# Patient Record
Sex: Male | Born: 1980 | Race: White | Hispanic: No | Marital: Single | Smoking: Current every day smoker
Health system: Southern US, Community
[De-identification: ages and names within clinical notes are randomized; demographics above are authoritative.]

## PROBLEM LIST (undated history)

## (undated) DIAGNOSIS — T8859XA Other complications of anesthesia, initial encounter: Secondary | ICD-10-CM

## (undated) DIAGNOSIS — K219 Gastro-esophageal reflux disease without esophagitis: Secondary | ICD-10-CM

## (undated) HISTORY — PX: WISDOM TOOTH EXTRACTION: SHX21

## (undated) HISTORY — DX: Gastro-esophageal reflux disease without esophagitis: K21.9

---
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
## 2006-12-23 ENCOUNTER — Ambulatory Visit: Payer: Self-pay | Admitting: Internal Medicine

## 2009-09-10 ENCOUNTER — Emergency Department (HOSPITAL_COMMUNITY): Admission: EM | Admit: 2009-09-10 | Discharge: 2009-09-10 | Payer: Self-pay | Admitting: Emergency Medicine

## 2011-08-15 ENCOUNTER — Inpatient Hospital Stay (INDEPENDENT_AMBULATORY_CARE_PROVIDER_SITE_OTHER)
Admission: RE | Admit: 2011-08-15 | Discharge: 2011-08-15 | Disposition: A | Discharge status: Home / Self Care | Payer: Self-pay | Source: Ambulatory Visit | Attending: Family Medicine | Admitting: Family Medicine

## 2011-08-15 DIAGNOSIS — J019 Acute sinusitis, unspecified: Secondary | ICD-10-CM

## 2014-12-20 ENCOUNTER — Emergency Department (INDEPENDENT_AMBULATORY_CARE_PROVIDER_SITE_OTHER)
Admission: EM | Admit: 2014-12-20 | Discharge: 2014-12-20 | Disposition: A | Discharge status: Home / Self Care | Payer: Self-pay | Source: Home / Self Care | Attending: Emergency Medicine | Admitting: Emergency Medicine

## 2014-12-20 ENCOUNTER — Encounter (HOSPITAL_COMMUNITY): Payer: Self-pay | Admitting: *Deleted

## 2014-12-20 DIAGNOSIS — J069 Acute upper respiratory infection, unspecified: Secondary | ICD-10-CM

## 2014-12-20 LAB — POCT RAPID STREP A: STREPTOCOCCUS, GROUP A SCREEN (DIRECT): NEGATIVE

## 2014-12-20 MED ORDER — NAPROXEN 500 MG PO TABS: 500.0000 mg | ORAL_TABLET | Freq: Two times a day (BID) | ORAL | Status: DC

## 2014-12-20 MED ORDER — IPRATROPIUM BROMIDE 0.06 % NA SOLN: 2.0000 | Freq: Four times a day (QID) | NASAL | Status: DC

## 2014-12-20 MED ORDER — BENZONATATE 200 MG PO CAPS: 200.0000 mg | ORAL_CAPSULE | Freq: Three times a day (TID) | ORAL | Status: DC | PRN

## 2014-12-20 NOTE — Discharge Instructions (Signed)

## 2014-12-20 NOTE — ED Notes (Signed)
C/o sore throat onset 1 week ago.  Started waking up with sinus pain 3 days ago, then developed a prod. cough of green sputum.  Had chills or fever.  C/o pressure in his ears, lightheaded and dizzy.

## 2014-12-20 NOTE — ED Provider Notes (Signed)
   Chief Complaint   URI   History of Present Illness   Douglas Maxwell is a 34 year old male who presents with one-week history of nasal congestion with yellow-green drainage, sinus pressure, ear pressure, cough productive green sputum, wheezing, chest pain, sore throat, subjective fever, sweats, chills, and diarrhea. He has no known sick exposures.  Review of Systems   Other than as noted above, the patient denies any of the following symptoms: Systemic:  No fevers, chills, sweats, or myalgias. Eye:  No redness or discharge. ENT:  No ear pain, headache, nasal congestion, drainage, sinus pressure, or sore throat. Neck:  No neck pain, stiffness, or swollen glands. Lungs:  No cough, sputum production, hemoptysis, wheezing, chest tightness, shortness of breath or chest pain. GI:  No abdominal pain, nausea, vomiting or diarrhea.  PMFSH   Past medical history, family history, social history, meds, and allergies were reviewed. He's allergic to antihistamines.  Physical exam   Vital signs:  BP 121/82 mmHg  Pulse 105  Temp(Src) 97.9 F (36.6 C) (Oral)  Resp 16  SpO2 98% General:  Alert and oriented.  In no distress.  Skin warm and dry. Eye:  No conjunctival injection or drainage. Lids were normal. ENT:  TMs and canals were normal, without erythema or inflammation.  Nasal mucosa was clear and uncongested, without drainage.  Mucous membranes were moist.  Pharynx was clear with no exudate or drainage.  There were no oral ulcerations or lesions. Neck:  Supple, no adenopathy, tenderness or mass. Lungs:  No respiratory distress.  Lungs were clear to auscultation, without wheezes, rales or rhonchi.  Breath sounds were clear and equal bilaterally.  Heart:  Regular rhythm, without gallops, murmers or rubs. Skin:  Clear, warm, and dry, without rash or lesions.  Labs   Results for orders placed or performed during the hospital encounter of 12/20/14  POCT rapid strep A [facility]    Result Value Ref Range   Streptococcus, Group A Screen (Direct) NEGATIVE NEGATIVE    Assessment     The encounter diagnosis was Viral URI.  There is no evidence of pneumonia, strep throat, sinusitis, otitis media.    Plan    1.  Meds:  The following meds were prescribed:   Discharge Medication List as of 12/20/2014  8:31 PM    START taking these medications   Details  benzonatate (TESSALON) 200 MG capsule Take 1 capsule (200 mg total) by mouth 3 (three) times daily as needed for cough., Starting 12/20/2014, Until Discontinued, Normal    ipratropium (ATROVENT) 0.06 % nasal spray Place 2 sprays into both nostrils 4 (four) times daily., Starting 12/20/2014, Until Discontinued, Normal    naproxen (NAPROSYN) 500 MG tablet Take 1 tablet (500 mg total) by mouth 2 (two) times daily., Starting 12/20/2014, Until Discontinued, Normal        2.  Patient Education/Counseling:  The patient was given appropriate handouts, self care instructions, and instructed in symptomatic relief.  Instructed to get extra fluids and extra rest.    3.  Follow up:  The patient was told to follow up here if no better in 3 to 4 days, or sooner if becoming worse in any way, and given some red flag symptoms such as increasing fever, difficulty breathing, chest pain, or persistent vomiting which would prompt immediate return.       Reuben Likesavid C Edon Hoadley, MD 12/20/14 2056

## 2014-12-23 LAB — CULTURE, GROUP A STREP

## 2014-12-24 ENCOUNTER — Telehealth (HOSPITAL_COMMUNITY): Payer: Self-pay | Admitting: *Deleted

## 2014-12-24 MED ORDER — AMOXICILLIN 500 MG PO CAPS: 500.0000 mg | ORAL_CAPSULE | Freq: Three times a day (TID) | ORAL | Status: DC

## 2014-12-24 NOTE — ED Notes (Addendum)
Throat culture: Streptococcus beta hemolytic not group A strep.  Dr. Lorenz CoasterKeller sent a message to have someone put in order for Amoxicillin 500 mg. TID x 5 days.  I asked Almedia BallsZach Baker PA to put in order.  [facility] [address] I called pt. But mailbox is full.  I sent a phone number to call.  Call 1. Vassie MoselleYork, Toris Laverdiere M 12/24/2014 I called pt. Pt. verified x 2 and given result.  Pt. told he needs Amoxicillin for strep throat.  He said [facility] and they told him about the Rx. of Amoxicillin, so he is taking it since Sunday.  Instructed to finish all of medicine, if not better after to get rechecked, and if anyone he exposed gets the same symptoms should get checked for strep as well.  Pt. Vviced understanding. Vassie MoselleYork, Keyasha Miah M 12/27/2014

## 2015-05-12 ENCOUNTER — Encounter (HOSPITAL_COMMUNITY): Payer: Self-pay | Admitting: Emergency Medicine

## 2015-05-12 ENCOUNTER — Emergency Department (HOSPITAL_COMMUNITY)
Admission: EM | Admit: 2015-05-12 | Discharge: 2015-05-12 | Disposition: A | Discharge status: Home / Self Care | Payer: Self-pay | Attending: Emergency Medicine | Admitting: Emergency Medicine

## 2015-05-12 DIAGNOSIS — K029 Dental caries, unspecified: Secondary | ICD-10-CM | POA: Insufficient documentation

## 2015-05-12 DIAGNOSIS — Z720 Tobacco use: Secondary | ICD-10-CM | POA: Insufficient documentation

## 2015-05-12 DIAGNOSIS — Z791 Long term (current) use of non-steroidal anti-inflammatories (NSAID): Secondary | ICD-10-CM | POA: Insufficient documentation

## 2015-05-12 MED ORDER — PENICILLIN V POTASSIUM 500 MG PO TABS: 500.0000 mg | ORAL_TABLET | Freq: Three times a day (TID) | ORAL | Status: DC

## 2015-05-12 MED ORDER — IBUPROFEN 800 MG PO TABS: 800.0000 mg | ORAL_TABLET | Freq: Three times a day (TID) | ORAL | Status: DC

## 2015-05-12 MED ORDER — PENICILLIN V POTASSIUM 500 MG PO TABS
500.0000 mg | ORAL_TABLET | Freq: Once | ORAL | Status: AC
  Administered 2015-05-12: 500 mg via ORAL
  Filled 2015-05-12: qty 1

## 2015-05-12 NOTE — ED Notes (Signed)
Pt states he has been having dental pain in upper jaw in middle section x 2 weeks. There is some tooth decay and swelling noted.

## 2015-05-12 NOTE — Discharge Instructions (Signed)
Dental Caries °Dental caries is tooth decay. This decay can cause a hole in teeth (cavity) that can get bigger and deeper over time. °HOME CARE °· Brush and floss your teeth. Do this at least two times a day. °· Use a fluoride toothpaste. °· Use a mouth rinse if told by your dentist or doctor. °· Eat less sugary and starchy foods. Drink less sugary drinks. °· Avoid snacking often on sugary and starchy foods. Avoid sipping often on sugary drinks. °· Keep regular checkups and cleanings with your dentist. °· Use fluoride supplements if told by your dentist or doctor. °· Allow fluoride to be applied to teeth if told by your dentist or doctor. °Document Released: 08/12/2008 Document Revised: 03/20/2014 Document Reviewed: 11/05/2012 °ExitCare® Patient Information ©2015 [employer]. This information is not intended to replace advice given to you by your health care provider. Make sure you discuss any questions you have with your health care provider. ° °Emergency Department Resource Guide °1) Find a Doctor and Pay Out of Pocket °Although you won't have to find out who is covered by your insurance plan, it is a good idea to ask around and get recommendations. You will then need to call the office and see if the doctor you have chosen will accept you as a new patient and what types of options they offer for patients who are self-pay. Some doctors offer discounts or will set up payment plans for their patients who do not have insurance, but you will need to ask so you aren't surprised when you get to your appointment. ° °2) Contact Your Local Health Department °Not all health departments have doctors that can see patients for sick visits, but many do, so it is worth a call to see if yours does. If you don't know where your local health department is, you can check in your phone book. The CDC also has a tool to help you locate your state's health department, and many state websites also have listings of all of their local  health departments. ° °3) Find a Walk-in Clinic °If your illness is not likely to be very severe or complicated, you may want to try a walk in clinic. These are popping up all over the country in pharmacies, drugstores, and shopping centers. They're usually staffed by nurse practitioners or physician assistants that have been trained to treat common illnesses and complaints. They're usually fairly quick and inexpensive. However, if you have serious medical issues or chronic medical problems, these are probably not your best option. ° °No Primary Care Doctor: °- Call Health Connect at  832-8000 - they can help you locate a primary care doctor that  accepts your insurance, provides certain services, etc. °- Physician Referral Service- 1-800-533-3463 ° °Chronic Pain Problems: °Organization         Address  Phone   Notes  °[facility]  ([phone]) [phone] Patients need to be referred by their primary care doctor.  ° °Medication Assistance: °Organization         Address  Phone   Notes  °[place] [facility] [address] °[place], [place] [address] (336) 641-8030 --Must be a resident of [place] °-- Must have NO insurance coverage whatsoever (no Medicaid/ Medicare, etc.) °-- The pt. MUST have a primary care doctor that directs their care regularly and follows them in the community °  °[facility]  (866) 331-1348   °[employer]  (888) 892-1162   ° °Agencies that   provide inexpensive medical care: °Organization         Address  Phone   Notes  °[facility]  ([phone]) [phone]   °[facility]    (336) 832-7272   °[facility] [address] °[place], [place] [address] (336) 832-4777   °[facility] [address], °[place] (336) 271-4999   °[facility]    (336) 373-0678   °[facility]    (336) 272-1050   °[facility] ° [address], [place] Phone:   (336) 832-4444, Fax:  (336) 832-4440 Hours of Operation:  9 am - 6 pm, M-F.  Also accepts Medicaid/Medicare and self-pay.  °[facility] ° [address], [place] Phone: (336) [phone], Fax: (336) [phone]. Hours of Operation:  8:30 am - 5:30 pm, M-F.  Also accepts Medicaid and self-pay.  °[facility] [place] [address], [place] Phone: (336) [phone]   °[facility] [address], [place], [place] (336)723-1848, Ext. 123 Mondays & Thursdays: 7-9 AM.  First 15 patients are seen on a first come, first serve basis. °  ° °Medicaid-accepting [place] Providers: ° °Organization         Address  Phone   Notes  °[facility] [address], [place] ([phone]) [phone] Also accepts self-pay patients.  °[facility] [address], [place] ° (336) [phone]   °[facility] [address], [place] (336) [phone]   °[facility] [address] [place] [address], [place] (336) [phone]   °[facility] [address], [place]  ° (336) [phone] Only accepts [place] Access Medicaid patients after they have their name applied to their card.  ° °Self-Pay (no insurance) in [place]: ° °Organization         Address  Phone   Notes  °Sickle Cell Patients, [facility] [address], [place] (336) [phone]   °[facility] [address],  [place] (336) [phone]   °[facility] ° [address], [place] (336) [phone]   °[facility]/Dr. Osei-Bonsu ° [address] [place] [address], [place] or [address], [place] ([phone]) [phone] Phone number for both [place] and [place] locations is the same.  °[facility] [address], [place] (336) [phone]   °[facility] [address] [place] [address], [place] or [address] (336)  [phone] °(336) 878-2260   °[facility] [address], [place] (336) 350-1642, phone; (336) 294-5005, fax Sees patients 1st and 3rd Saturday of every month.  Must not qualify for public or private insurance (i.e. Medicaid, Medicare, [place] Health Choice, Veterans' Benefits) • Household income should be no more than 200% of the poverty level •The clinic cannot treat you if you are pregnant or think you are pregnant • Sexually transmitted diseases are not treated at the clinic.  ° ° °Dental Care: °Organization         Address  Phone  Notes  °[facility] [address],  (336) [phone] Accepts children up to age 21 who are enrolled in Medicaid or [place] Health Choice; pregnant women with a Medicaid card; and children who have applied for Medicaid or [place] Health   Choice, but were declined, whose parents can pay a reduced fee at time of service.  °[facility] [place]  [address], [place] (336) 641-7733 Accepts children up to age 21 who are enrolled in Medicaid or [place] Health Choice; pregnant women with a Medicaid card; and children who have applied for Medicaid or [place] Health Choice, but were declined, whose parents can pay a reduced fee at time of service.  °[facility] ° [address], [place] (336) [phone] Patients are seen by appointment only. Walk-ins are not accepted. [facility] will see patients 18 years of age and older. °Monday - Tuesday (8am-5pm) °Most Wednesdays (8:30-5pm) °$30 per visit, cash only  °[facility] ° [address], [place] (336) [phone] Patients are seen by appointment only. Walk-ins are not accepted. [facility] will see patients 18 years of age and older. °One Wednesday Evening (Monthly: Volunteer Based).  $30 per visit, cash only  °[facility]  (919) 537-3737 for adults;  Children under age 4, call Graduate Pediatric Dentistry at ([phone]) [phone]. Children aged 4-14, please call ([phone]) [phone] to request a pediatric application. ° Dental services are provided in all areas of dental care including fillings, crowns and bridges, complete and partial dentures, implants, gum treatment, root canals, and extractions. Preventive care is also provided. Treatment is provided to both adults and children. °Patients are selected via a lottery and there is often a waiting list. °  °[facility] [address], °[place] ° (336) 763-8833 [email or web address] °  °[facility] [address], [place], [place] (336)723-1848, Ext. 123 Second and Fourth Thursday of each month, opens at 6:30 AM; Clinic ends at 9 AM.  Patients are seen on a first-come first-served basis, and a limited number are seen during each clinic.  ° °[facility] ° [address], [place], [place] (336) [phone]   Eligibility Requirements °You must have lived in [place], [place], or [place] counties for at least the last three months. °  You cannot be eligible for state or federal sponsored healthcare insurance, including Veterans Administration, Medicaid, or Medicare. °  You generally cannot be eligible for healthcare insurance through your employer.  °  How to apply: °Eligibility screenings are held every Tuesday and Wednesday afternoon from 1:00 pm until 4:00 pm. You do not need an appointment for the interview!  °[facility] [address], [place], [place] [phone]   °[facility]  [phone]   °[facility]  [phone]   °[facility] [place]  [phone]   ° °

## 2015-05-12 NOTE — ED Notes (Signed)
Patient is alert and oriented x3.  He was given DC instructions and follow up visit instructions.  Patient gave verbal understanding.  He was DC ambulatory under his own power to home.  V/S stable.  He was not showing any signs of distress on DC 

## 2015-05-12 NOTE — ED Provider Notes (Signed)
CSN: [id number]     Arrival date & time 05/12/15  0436 History   First MD Initiated Contact with Patient 05/12/15 0448     Chief Complaint  Patient presents with  . Dental Pain     (Consider location/radiation/quality/duration/timing/severity/associated sxs/prior Treatment) Patient is a 34 y.o. male presenting with tooth pain. The history is provided by the patient. No language interpreter was used.  Dental Pain Location:  Upper Associated symptoms: facial swelling   Associated symptoms: no fever   Associated symptoms comment:  Pain in upper incisors for 2 weeks with facial swelling and worsening pain that started tonight. No fever, nausea or vomiting.   History reviewed. No pertinent past medical history. Past Surgical History  Procedure Laterality Date  . Tonsillectomy  1990   Family History  Problem Relation Age of Onset  . Cancer Mother     lung  . Diabetes Father   . COPD Father    History  Substance Use Topics  . Smoking status: Current Every Day Smoker -- 0.90 packs/day    Types: Cigarettes  . Smokeless tobacco: Not on file  . Alcohol Use: No    Review of Systems  Constitutional: Negative for fever and chills.  HENT: Positive for dental problem and facial swelling. Negative for trouble swallowing.   Gastrointestinal: Negative.  Negative for nausea and vomiting.  Musculoskeletal: Negative for neck stiffness.      Allergies  Antihistamines, diphenhydramine-type  Home Medications   Prior to Admission medications   Medication Sig Start Date End Date Taking? Authorizing Provider  benzocaine (ORAJEL) 10 % mucosal gel Use as directed 1 application in the mouth or throat 3 (three) times daily as needed for mouth pain.   Yes Historical Provider, MD  naproxen sodium (ANAPROX) 220 MG tablet Take 220 mg by mouth 2 (two) times daily with a meal.   Yes Historical Provider, MD  amoxicillin (AMOXIL) 500 MG capsule Take 1 capsule (500 mg total) by mouth 3 (three) times  daily. Patient not taking: Reported on 05/12/2015 12/24/14   Graylon Good, PA-C  benzonatate (TESSALON) 200 MG capsule Take 1 capsule (200 mg total) by mouth 3 (three) times daily as needed for cough. Patient not taking: Reported on 05/12/2015 12/20/14   Reuben Likes, MD  ipratropium (ATROVENT) 0.06 % nasal spray Place 2 sprays into both nostrils 4 (four) times daily. Patient not taking: Reported on 05/12/2015 12/20/14   Reuben Likes, MD  naproxen (NAPROSYN) 500 MG tablet Take 1 tablet (500 mg total) by mouth 2 (two) times daily. Patient not taking: Reported on 05/12/2015 12/20/14   Reuben Likes, MD   BP 158/94 mmHg  Pulse 70  Temp(Src) 98 F (36.7 C) (Oral)  Resp 16  Ht  (1.753 m)  Wt 290 lb (131.543 kg)  BMI 42.81 kg/m2  SpO2 98% Physical Exam  Constitutional: He is oriented to person, place, and time. He appears well-developed and well-nourished.  HENT:  Significant decay of all upper incisors. No pointing abscess visualized. Minimal facial swelling of upper lip to base of nose without palpable cutaneous abscess.   Neck: Normal range of motion. Neck supple.  Pulmonary/Chest: Effort normal.  Musculoskeletal: Normal range of motion.  Neurological: He is alert and oriented to person, place, and time.  Skin: Skin is warm and dry.  Psychiatric: He has a normal mood and affect.    ED Course  Procedures (including critical care time) Labs Review Labs Reviewed - No data to display  Imaging Review No results found.   EKG Interpretation None      MDM   Final diagnoses:  None    1. Dental caries  No fever. Will cover with pcn, ibuprofen and dental follow up.     Elpidio Anis, PA-C 05/12/15 8675  Marisa Severin, MD 05/12/15 [phone]

## 2015-05-12 NOTE — ED Notes (Signed)
Patient states that he started feeling pain in the upper frontal gum area.  Currently he rates his pain 6 of 10.  The area does have notable blanching  To the area.

## 2015-05-18 ENCOUNTER — Encounter (HOSPITAL_COMMUNITY): Payer: Self-pay | Admitting: Emergency Medicine

## 2015-05-18 ENCOUNTER — Emergency Department (HOSPITAL_COMMUNITY)
Admission: EM | Admit: 2015-05-18 | Discharge: 2015-05-18 | Disposition: A | Discharge status: Home / Self Care | Payer: Self-pay | Attending: Emergency Medicine | Admitting: Emergency Medicine

## 2015-05-18 DIAGNOSIS — K0500 Acute gingivitis, plaque induced: Secondary | ICD-10-CM | POA: Insufficient documentation

## 2015-05-18 DIAGNOSIS — Z792 Long term (current) use of antibiotics: Secondary | ICD-10-CM | POA: Insufficient documentation

## 2015-05-18 DIAGNOSIS — H9209 Otalgia, unspecified ear: Secondary | ICD-10-CM | POA: Insufficient documentation

## 2015-05-18 DIAGNOSIS — Z791 Long term (current) use of non-steroidal anti-inflammatories (NSAID): Secondary | ICD-10-CM | POA: Insufficient documentation

## 2015-05-18 DIAGNOSIS — R05 Cough: Secondary | ICD-10-CM | POA: Insufficient documentation

## 2015-05-18 DIAGNOSIS — Z720 Tobacco use: Secondary | ICD-10-CM | POA: Insufficient documentation

## 2015-05-18 DIAGNOSIS — K029 Dental caries, unspecified: Secondary | ICD-10-CM | POA: Insufficient documentation

## 2015-05-18 MED ORDER — HYDROCODONE-ACETAMINOPHEN 5-325 MG PO TABS: 1.0000 | ORAL_TABLET | Freq: Four times a day (QID) | ORAL | Status: DC | PRN

## 2015-05-18 MED ORDER — CLINDAMYCIN HCL 150 MG PO CAPS: 150.0000 mg | ORAL_CAPSULE | Freq: Four times a day (QID) | ORAL | Status: DC

## 2015-05-18 NOTE — ED Notes (Signed)
Pt escorted to discharge window. Verbalized understanding discharge instructions. In no acute distress.   

## 2015-05-18 NOTE — ED Notes (Signed)
Pt reports he was here a few days ago for L upper dental pain. Was discharged with prescription for abx. Last night pain became worse.

## 2015-05-18 NOTE — Discharge Instructions (Signed)
Follow up closely with dentist for further management of your infection.  Gingivitis Gingivitis is a form of gum (periodontal) disease that causes redness, soreness, and swelling (inflammation) of your gums. CAUSES The most common cause of gingivitis is poor oral hygiene. A sticky substance made of bacteria, mucus, and food particles (plaque), is deposited on the exposed part of teeth. As plaque builds up, it reacts with the saliva in your mouth to form something called  tartar. Tartar is a hard deposit that becomes trapped around the base of the tooth. Plaque and tartar irritate the gums, leading to the formation of gingivitis. Other factors that increase your risk for gingivitis include:   Tobacco use.  Diabetes.  Older age.  Certain medications.  Certain viral or fungal infections.  Dry mouth.  Hormonal changes such as during pregnancy.  Poor nutrition.  Substance abuse.  Poor fitting dental restorations or appliances. SYMPTOMS You may notice inflammation of the soft tissue (gingiva) around the teeth. When these tissues become inflamed, they bleed easily, especially during flossing or brushing. The gums may also be:   Tender to the touch.  Bright red, purple red, or have a shiny appearance.  Swollen.  Wearing away from the teeth (receding), which exposes more of the tooth. Bad breath is often present. Continued infection around teeth can eventually cause cavities and loosen teeth. This may lead to eventual tooth loss. DIAGNOSIS A medical and dental history will be taken. Your mouth, teeth, and gums will be examined. Your dentist will look for soft, swollen purple-red, irritated gums. There may be deposits of plaque and tartar at the base of the teeth. Your gums will be looked at for the degree of redness, puffiness, and bleeding tendencies. Your dentist will see if any of the teeth are loose. X-rays may be taken to see if the inflammation has spread to the supporting  structures of the teeth. TREATMENT The goal is to reduce and reverse the inflammation. Proper treatment can usually reverse the symptoms of gingivitis and prevent further progression of the disease. Have your teeth cleaned. During the cleaning, all plaque and tartar will be removed. Instruction for proper home care will be given. You will need regular professional cleanings and check-ups in the future. HOME CARE INSTRUCTIONS  Brush your teeth twice a day and floss at least once per day. When flossing, it is best to floss first then brush.  Limit sugar between meals and maintain a well-balanced diet.  Even the best dental hygiene will not prevent plaque from developing. It is necessary for you to see your dentist on a regular basis for cleaning and regular checkups.  Your dentist can recommend proper oral hygiene and mouth care and suggest special toothpastes or mouth rinses.  Stop smoking. SEEK DENTAL OR MEDICAL CARE IF:  You have painful, reddened tissue around your teeth, or you have puffy swollen gums.  You have difficulty chewing.  You notice any loose or infected teeth.  You have swollen glands.  Your gums bleed easily when you brush your teeth or are very tender to the touch. Document Released: 04/29/2001 Document Revised: 01/26/2012 Document Reviewed: 02/07/2011 [facility] Patient Information 2015 [place], [place]. This information is not intended to replace advice given to you by your health care provider. Make sure you discuss any questions you have with your health care provider.

## 2015-05-18 NOTE — ED Provider Notes (Signed)
CSN: [id number]     Arrival date & time 05/18/15  1210 History  This chart was scribed for non-physician practitioner, Fayrene HelperBowie Anias Bartol, PA-C, working with Doug SouSam Jacubowitz, MD by Marica OtterNusrat Rahman, ED Scribe. This patient was seen in room WTR7/WTR7 and the patient's care was started at 12:32 PM.   Chief Complaint  Patient presents with  . Dental Pain   The history is provided by the patient. No language interpreter was used.   PCP: No primary care provider on file. HPI Comments: Douglas Maxwell is a 34 y.o. male, with PMH noted below, who presents to the Emergency Department complaining of persistent, atraumatic, intermittent, worsening, 8/10 upper gumline pain radiating to the back of the head onset last night. Pt reports he was seen at the ED on 05/12/15 for upper incisors pain with associated facial swelling whereby pt was discharged home with Rx for antibiotics. Pt reports he started the abx and his Sx improved drastically until last night when he began to experience radiating upper gumline pain radiating to the back of his head. Pt reports he has an appointment with a dentist on Tuesday. Pt also complains of a mild cough and mild ear pain. Pt denies fever, chills, nausea, vomiting, rhinorrhea, ear discharge, trouble swallowing.    History reviewed. No pertinent past medical history. Past Surgical History  Procedure Laterality Date  . Tonsillectomy  1990   Family History  Problem Relation Age of Onset  . Cancer Mother     lung  . Diabetes Father   . COPD Father    History  Substance Use Topics  . Smoking status: Current Every Day Smoker -- 0.90 packs/day    Types: Cigarettes  . Smokeless tobacco: Not on file  . Alcohol Use: No    Review of Systems  Constitutional: Negative for fever and chills.  HENT: Positive for dental problem and ear pain. Negative for rhinorrhea and trouble swallowing.   Respiratory: Positive for cough.   Gastrointestinal: Negative for nausea and vomiting.       Allergies  Antihistamines, diphenhydramine-type  Home Medications   Prior to Admission medications   Medication Sig Start Date End Date Taking? Authorizing Provider  amoxicillin (AMOXIL) 500 MG capsule Take 1 capsule (500 mg total) by mouth 3 (three) times daily. Patient not taking: Reported on 05/12/2015 12/24/14   Graylon GoodZachary H Baker, PA-C  benzocaine (ORAJEL) 10 % mucosal gel Use as directed 1 application in the mouth or throat 3 (three) times daily as needed for mouth pain.    Historical Provider, MD  benzonatate (TESSALON) 200 MG capsule Take 1 capsule (200 mg total) by mouth 3 (three) times daily as needed for cough. Patient not taking: Reported on 05/12/2015 12/20/14   Reuben Likesavid C Keller, MD  ibuprofen (ADVIL,MOTRIN) 800 MG tablet Take 1 tablet (800 mg total) by mouth 3 (three) times daily. 05/12/15   Elpidio AnisShari Upstill, PA-C  ipratropium (ATROVENT) 0.06 % nasal spray Place 2 sprays into both nostrils 4 (four) times daily. Patient not taking: Reported on 05/12/2015 12/20/14   Reuben Likesavid C Keller, MD  naproxen (NAPROSYN) 500 MG tablet Take 1 tablet (500 mg total) by mouth 2 (two) times daily. Patient not taking: Reported on 05/12/2015 12/20/14   Reuben Likesavid C Keller, MD  naproxen sodium (ANAPROX) 220 MG tablet Take 220 mg by mouth 2 (two) times daily with a meal.    Historical Provider, MD  penicillin v potassium (VEETID) 500 MG tablet Take 1 tablet (500 mg total) by mouth 3 (three)  times daily. 05/12/15   Elpidio Anis, PA-C   Triage Vitals: BP 136/87 mmHg  Pulse 78  Temp(Src) 98.2 F (36.8 C) (Oral)  Resp 16  SpO2 98% Physical Exam  Constitutional: He is oriented to person, place, and time. He appears well-developed and well-nourished. No distress.  HENT:  Head: Normocephalic and atraumatic.  Significant dental decay noted, with more significant decay to upper gumline with erythema consistent with gingivitis. Tender to palpation. No pointing abscess or drainage. No mastoiditis. No TMJ.   Eyes:  Conjunctivae and EOM are normal.  Cardiovascular: Normal rate.   Pulmonary/Chest: Effort normal. No respiratory distress.  Musculoskeletal: Normal range of motion.  Neurological: He is alert and oriented to person, place, and time.  Skin: Skin is warm and dry.  Psychiatric: He has a normal mood and affect. His behavior is normal.  Nursing note and vitals reviewed.   ED Course  Procedures (including critical care time) DIAGNOSTIC STUDIES: Oxygen Saturation is 98% on RA, nl by my interpretation.    COORDINATION OF CARE: 12:36 PM-patient with significant dental decay and moderate gingivitis to maxillary gumline. No facial swelling appreciated. No obvious abscess amenable for drainage. Discussed treatment plan which includes antibiotics, pain meds, ibuprofen with pt at bedside and pt agreed to plan.   Labs Review Labs Reviewed - No data to display  Imaging Review No results found.   EKG Interpretation None      MDM   Final diagnoses:  Gingivitis, acute, plaque induced  Dental decay    BP 136/87 mmHg  Pulse 78  Temp(Src) 98.2 F (36.8 C) (Oral)  Resp 16  SpO2 98%   I personally performed the services described in this documentation, which was scribed in my presence. The recorded information has been reviewed and is accurate.     Fayrene Helper, PA-C 05/18/15 1252  Doug Sou, MD 05/18/15 [phone]

## 2015-07-02 ENCOUNTER — Telehealth: Payer: Self-pay | Admitting: Internal Medicine

## 2015-07-02 NOTE — Telephone Encounter (Signed)
Ok with me 

## 2015-07-02 NOTE — Telephone Encounter (Signed)
Patient use to be a patient of Dr. Jonny Ruiz.  He is requesting to reestablish with Dr. Jonny Ruiz.  Please advise.

## 2015-07-03 NOTE — Telephone Encounter (Signed)
Left vm for patient to call back to schedule

## 2015-07-17 ENCOUNTER — Emergency Department (HOSPITAL_COMMUNITY)
Admission: EM | Admit: 2015-07-17 | Discharge: 2015-07-17 | Disposition: A | Discharge status: Home / Self Care | Payer: BLUE CROSS/BLUE SHIELD | Attending: Emergency Medicine | Admitting: Emergency Medicine

## 2015-07-17 ENCOUNTER — Encounter (HOSPITAL_COMMUNITY): Payer: Self-pay

## 2015-07-17 DIAGNOSIS — Z79899 Other long term (current) drug therapy: Secondary | ICD-10-CM | POA: Diagnosis not present

## 2015-07-17 DIAGNOSIS — Z720 Tobacco use: Secondary | ICD-10-CM | POA: Insufficient documentation

## 2015-07-17 DIAGNOSIS — R200 Anesthesia of skin: Secondary | ICD-10-CM | POA: Diagnosis not present

## 2015-07-17 DIAGNOSIS — M5410 Radiculopathy, site unspecified: Secondary | ICD-10-CM

## 2015-07-17 DIAGNOSIS — M25512 Pain in left shoulder: Secondary | ICD-10-CM | POA: Diagnosis present

## 2015-07-17 MED ORDER — KETOROLAC TROMETHAMINE 60 MG/2ML IM SOLN
60.0000 mg | Freq: Once | INTRAMUSCULAR | Status: AC
  Administered 2015-07-17: 60 mg via INTRAMUSCULAR
  Filled 2015-07-17: qty 2

## 2015-07-17 NOTE — ED Provider Notes (Signed)
CSN: [id number]     Arrival date & time 07/17/15  0605 History   First MD Initiated Contact with Patient 07/17/15 [phone]     Chief Complaint  Patient presents with  . Shoulder Pain  . Numbness     (Consider location/radiation/quality/duration/timing/severity/associated sxs/prior Treatment) HPI Douglas Maxwell is a 34 y.o. male who comes in for evaluation of left shoulder pain and intermittent numbness for the past 3 weeks. 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 He is tried taking Advil once without relief. Nothing seems to make the problem better or worse. He rates his discomfort as a 7/10. Denies any fevers, chills, neck pain, decreased range of motion, cool extremities. States he has just recently established primary care and has his first appointment on Thursday of this week.  History reviewed. No pertinent past medical history. Past Surgical History  Procedure Laterality Date  . Tonsillectomy  1990   Family History  Problem Relation Age of Onset  . Cancer Mother     lung  . Diabetes Father   . COPD Father    Social History  Substance Use Topics  . Smoking status: Current Every Day Smoker -- 0.90 packs/day    Types: Cigarettes  . Smokeless tobacco: None  . Alcohol Use: No    Review of Systems A 10 point review of systems was completed and was negative except for pertinent positives and negatives as mentioned in the history of present illness     Allergies  Antihistamines, diphenhydramine-type  Home Medications   Prior to Admission medications   Medication Sig Start Date End Date Taking? Authorizing Provider  ibuprofen (ADVIL,MOTRIN) 200 MG tablet Take 200 mg by mouth every 6 (six) hours as needed for mild pain.   Yes Historical Provider, MD  amoxicillin (AMOXIL) 500 MG capsule Take 1 capsule (500 mg  total) by mouth 3 (three) times daily. Patient not taking: Reported on 05/12/2015 12/24/14   Graylon Good, PA-C  benzonatate (TESSALON) 200 MG capsule Take 1 capsule (200 mg total) by mouth 3 (three) times daily as needed for cough. Patient not taking: Reported on 05/12/2015 12/20/14   Reuben Likes, MD  clindamycin (CLEOCIN) 150 MG capsule Take 1 capsule (150 mg total) by mouth every 6 (six) hours. Patient not taking: Reported on 07/17/2015 05/18/15   Fayrene Helper, PA-C  HYDROcodone-acetaminophen (NORCO/VICODIN) 5-325 MG per tablet Take 1 tablet by mouth every 6 (six) hours as needed for moderate pain or severe pain. Patient not taking: Reported on 07/17/2015 05/18/15   Fayrene Helper, PA-C  ibuprofen (ADVIL,MOTRIN) 800 MG tablet Take 1 tablet (800 mg total) by mouth 3 (three) times daily. Patient not taking: Reported on 07/17/2015 05/12/15   Elpidio Anis, PA-C  ipratropium (ATROVENT) 0.06 % nasal spray Place 2 sprays into both nostrils 4 (four) times daily. Patient not taking: Reported on 05/12/2015 12/20/14   Reuben Likes, MD  naproxen (NAPROSYN) 500 MG tablet Take 1 tablet (500 mg total) by mouth 2 (two) times daily. Patient not taking: Reported on 05/12/2015 12/20/14   Reuben Likes, MD  penicillin v potassium (VEETID) 500 MG tablet Take 1 tablet (500 mg total) by mouth 3 (three) times daily. Patient not taking: Reported on 07/17/2015 05/12/15   Elpidio Anis, PA-C   BP 122/87 mmHg  Pulse 84  Temp(Src) 97.8 F (36.6  C)  Resp 16  Ht  (1.753 m)  Wt 280 lb (127.007 kg)  BMI 41.33 kg/m2 Physical Exam  Constitutional:  Awake, alert, nontoxic appearance.  HENT:  Head: Atraumatic.  Eyes: Right eye exhibits no discharge. Left eye exhibits no discharge.  Neck: Neck supple.  Cardiovascular: Normal rate, regular rhythm, normal heart sounds and intact distal pulses.   Pulmonary/Chest: Effort normal. He exhibits no tenderness.  Abdominal: Soft. There is no tenderness. There is no rebound.   Musculoskeletal: He exhibits no tenderness.  Baseline ROM, no obvious new focal weakness. Patient maintains full active range of motion of left shoulder and bilateral upper extremities. Full range of motion of cervical and thoracic spine. No focal tenderness. No lesions or deformities. Patient does have multiple body piercings, one over base of cervical spine, no evidence of infection.  Neurological:  Mental status and motor strength appears baseline for patient and situation. Sensation intact to light touch. Grip strength equal bilaterally.  Skin: No rash noted.  Psychiatric: He has a normal mood and affect.  Nursing note and vitals reviewed.   ED Course  Procedures (including critical care time) Labs Review Labs Reviewed - No data to display  Imaging Review No results found. I have personally reviewed and evaluated these images and lab results as part of my medical decision-making.   EKG Interpretation None     Meds given in ED:  Medications  ketorolac (TORADOL) injection 60 mg (60 mg Intramuscular Given 07/17/15 0645)    New Prescriptions   No medications on file   Filed Vitals:   07/17/15 0614  BP: 122/87  Pulse: 84  Temp: 97.8 F (36.6 C)  Resp: 16  Height:  (1.753 m)  Weight: 280 lb (127.007 kg)    MDM  Vitals stable - WNL -afebrile Pt resting comfortably in ED. PE--physical exam is as above and not concerning.  Patient's symptoms consistent with radiculopathy likely secondary to overuse injury. No evidence of other acute or emergent pathology. Doubt any emergent spinal cord pathology. Treated with Toradol in the ED. Discussed using Motrin at home until reevaluation by PCP on Thursday. I discussed all relevant lab findings and imaging results with pt and they verbalized understanding. Discussed f/u with PCP within 48 hrs and return precautions, pt very amenable to plan.  Final diagnoses:  Radiculopathy of arm        [name],  PA-C 07/17/15 9147  [name], MD 07/17/15 [phone]

## 2015-07-17 NOTE — ED Notes (Signed)
Pt comes from home c/o L shoulder pain X3 weeks and numbness X one and a half weeks. Pt has doctors appt on Thursday but pain was unbearable.

## 2015-07-17 NOTE — Discharge Instructions (Signed)
You may continue to take Motrin at home for your discomfort. Take 600 mg 3 times a day. Follow-up with your doctor for regularly scheduled appointment. Return to ED for worsening symptoms.  Cervical Radiculopathy Cervical radiculopathy happens when a nerve in the neck is pinched or bruised by a slipped (herniated) disk or by arthritic changes in the bones of the cervical spine. This can occur due to an injury or as part of the normal aging process. Pressure on the cervical nerves can cause pain or numbness that runs from your neck all the way down into your arm and fingers. CAUSES  There are many possible causes, including:  Injury.  Muscle tightness in the neck from overuse.  Swollen, painful joints (arthritis).  Breakdown or degeneration in the bones and joints of the spine (spondylosis) due to aging.  Bone spurs that may develop near the cervical nerves. SYMPTOMS  Symptoms include pain, weakness, or numbness in the affected arm and hand. Pain can be severe or irritating. Symptoms may be worse when extending or turning the neck. DIAGNOSIS  Your caregiver will ask about your symptoms and do a physical exam. He or she may test your strength and reflexes. X-rays, CT scans, and MRI scans may be needed in cases of injury or if the symptoms do not go away after a period of time. Electromyography (EMG) or nerve conduction testing may be done to study how your nerves and muscles are working. TREATMENT  Your caregiver may recommend certain exercises to help relieve your symptoms. Cervical radiculopathy can, and often does, get better with time and treatment. If your problems continue, treatment options may include:  Wearing a soft collar for short periods of time.  Physical therapy to strengthen the neck muscles.  Medicines, such as nonsteroidal anti-inflammatory drugs (NSAIDs), oral corticosteroids, or spinal injections.  Surgery. Different types of surgery may be done depending on the cause  of your problems. HOME CARE INSTRUCTIONS   Put ice on the affected area.  Put ice in a plastic bag.  Place a towel between your skin and the bag.  Leave the ice on for 15-20 minutes, 03-04 times a day or as directed by your caregiver.  If ice does not help, you can try using heat. Take a warm shower or bath, or use a hot water bottle as directed by your caregiver.  You may try a gentle neck and shoulder massage.  Use a flat pillow when you sleep.  Only take over-the-counter or prescription medicines for pain, discomfort, or fever as directed by your caregiver.  If physical therapy was prescribed, follow your caregiver's directions.  If a soft collar was prescribed, use it as directed. SEEK IMMEDIATE MEDICAL CARE IF:   Your pain gets much worse and cannot be controlled with medicines.  You have weakness or numbness in your hand, arm, face, or leg.  You have a high fever or a stiff, rigid neck.  You lose bowel or bladder control (incontinence).  You have trouble with walking, balance, or speaking. MAKE SURE YOU:   Understand these instructions.  Will watch your condition.  Will get help right away if you are not doing well or get worse. Document Released: 07/29/2001 Document Revised: 01/26/2012 Document Reviewed: 06/17/2011 [facility] Patient Information 2015 [place], [place]. This information is not intended to replace advice given to you by your health care provider. Make sure you discuss any questions you have with your health care provider.

## 2015-07-19 ENCOUNTER — Encounter: Payer: Self-pay | Admitting: Internal Medicine

## 2015-07-19 ENCOUNTER — Other Ambulatory Visit (INDEPENDENT_AMBULATORY_CARE_PROVIDER_SITE_OTHER): Payer: BLUE CROSS/BLUE SHIELD

## 2015-07-19 ENCOUNTER — Ambulatory Visit (INDEPENDENT_AMBULATORY_CARE_PROVIDER_SITE_OTHER): Payer: BLUE CROSS/BLUE SHIELD | Admitting: Internal Medicine

## 2015-07-19 VITALS — BP 130/86 | HR 83 | Temp 97.8°F | Ht 69.0 in | Wt 264.0 lb

## 2015-07-19 DIAGNOSIS — R739 Hyperglycemia, unspecified: Secondary | ICD-10-CM | POA: Diagnosis not present

## 2015-07-19 DIAGNOSIS — M5412 Radiculopathy, cervical region: Secondary | ICD-10-CM

## 2015-07-19 DIAGNOSIS — Z0000 Encounter for general adult medical examination without abnormal findings: Secondary | ICD-10-CM

## 2015-07-19 DIAGNOSIS — Z23 Encounter for immunization: Secondary | ICD-10-CM | POA: Diagnosis not present

## 2015-07-19 DIAGNOSIS — K219 Gastro-esophageal reflux disease without esophagitis: Secondary | ICD-10-CM | POA: Insufficient documentation

## 2015-07-19 LAB — HEPATIC FUNCTION PANEL
ALT: 53 U/L (ref 0–53)
AST: 27 U/L (ref 0–37)
Albumin: 4.1 g/dL (ref 3.5–5.2)
Alkaline Phosphatase: 71 U/L (ref 39–117)
Bilirubin, Direct: 0.1 mg/dL (ref 0.0–0.3)
Total Bilirubin: 0.3 mg/dL (ref 0.2–1.2)
Total Protein: 7 g/dL (ref 6.0–8.3)

## 2015-07-19 LAB — URINALYSIS, ROUTINE W REFLEX MICROSCOPIC
Bilirubin Urine: NEGATIVE
KETONES UR: NEGATIVE
Leukocytes, UA: NEGATIVE
Nitrite: NEGATIVE
PH: 7 (ref 5.0–8.0)
SPECIFIC GRAVITY, URINE: 1.02 (ref 1.000–1.030)
Total Protein, Urine: NEGATIVE
Urine Glucose: NEGATIVE
Urobilinogen, UA: 0.2 (ref 0.0–1.0)

## 2015-07-19 LAB — CBC WITH DIFFERENTIAL/PLATELET
BASOS ABS: 0 10*3/uL (ref 0.0–0.1)
Basophils Relative: 0.3 % (ref 0.0–3.0)
EOS PCT: 3.5 % (ref 0.0–5.0)
Eosinophils Absolute: 0.4 10*3/uL (ref 0.0–0.7)
HCT: 42 % (ref 39.0–52.0)
HEMOGLOBIN: 14.3 g/dL (ref 13.0–17.0)
Lymphocytes Relative: 35.5 % (ref 12.0–46.0)
Lymphs Abs: 4 10*3/uL (ref 0.7–4.0)
MCHC: 34.2 g/dL (ref 30.0–36.0)
MCV: 85.8 fl (ref 78.0–100.0)
MONO ABS: 0.8 10*3/uL (ref 0.1–1.0)
MONOS PCT: 7.1 % (ref 3.0–12.0)
Neutro Abs: 6 10*3/uL (ref 1.4–7.7)
Neutrophils Relative %: 53.6 % (ref 43.0–77.0)
Platelets: 301 10*3/uL (ref 150.0–400.0)
RBC: 4.9 Mil/uL (ref 4.22–5.81)
RDW: 14.4 % (ref 11.5–15.5)
WBC: 11.2 10*3/uL — AB (ref 4.0–10.5)

## 2015-07-19 LAB — LIPID PANEL
CHOLESTEROL: 175 mg/dL (ref 0–200)
HDL: 35.8 mg/dL — AB (ref >39.00)
LDL CALC: 102 mg/dL — AB (ref 0–99)
NonHDL: 139.15
TRIGLYCERIDES: 186 mg/dL — AB (ref 0.0–149.0)
Total CHOL/HDL Ratio: 5
VLDL: 37.2 mg/dL (ref 0.0–40.0)

## 2015-07-19 LAB — TSH: TSH: 1.71 u[IU]/mL (ref 0.35–4.50)

## 2015-07-19 LAB — BASIC METABOLIC PANEL
BUN: 15 mg/dL (ref 6–23)
CO2: 26 mEq/L (ref 19–32)
Calcium: 9.3 mg/dL (ref 8.4–10.5)
Chloride: 107 mEq/L (ref 96–112)
Creatinine, Ser: 0.78 mg/dL (ref 0.40–1.50)
GFR: 121.88 mL/min (ref >60.00)
Glucose, Bld: 118 mg/dL — ABNORMAL HIGH (ref 70–99)
Potassium: 3.6 mEq/L (ref 3.5–5.1)
SODIUM: 143 meq/L (ref 135–145)

## 2015-07-19 LAB — HEMOGLOBIN A1C: Hgb A1c MFr Bld: 5.8 % (ref 4.6–6.5)

## 2015-07-19 MED ORDER — PREDNISONE 10 MG PO TABS: ORAL_TABLET | ORAL | Status: DC

## 2015-07-19 NOTE — Assessment & Plan Note (Signed)
Moderate it seems, with neuro change as documented, will add prednisone pac, Cervical MRI, and refer orthopedic

## 2015-07-19 NOTE — Progress Notes (Signed)
Subjective:    Patient ID: Douglas Maxwell, male    DOB: [birth year], 34 y.o.   MRN: [id number]  HPI  Here for wellness and f/u;  Overall doing ok;  Pt denies Chest pain, worsening SOB, DOE, wheezing, orthopnea, PND, worsening LE edema, palpitations, dizziness or syncope.  Pt denies neurological change such as new headache, facial or extremity weakness.  Pt denies polydipsia, polyuria, or low sugar symptoms. Pt states overall good compliance with treatment and medications, good tolerability, and has been trying to follow appropriate diet.  Pt denies worsening depressive symptoms, suicidal ideation or panic. No fever, night sweats, wt loss, loss of appetite, or other constitutional symptoms.  Pt states good ability with ADL's, has low fall risk, home safety reviewed and adequate, no other significant changes in hearing or vision, and only occasionally active with exercise. S/p prolonged tx for dental abscess (no tooth loss yet but still might) for f/u exam soon. Also with 2 wks onset left neck pain , burning, constant with radiation to the arm, mod occas severe, assoc with left arm numbness and weakness not improving. Past Medical History  Diagnosis Date  . GERD (gastroesophageal reflux disease)    Past Surgical History  Procedure Laterality Date  . Tonsillectomy  1990    reports that he has been smoking Cigarettes.  He has been smoking about 0.90 packs per day. He does not have any smokeless tobacco history on file. He reports that he does not drink alcohol. His drug history is not on file. family history includes COPD in his father; Cancer in his mother; Diabetes in his father. Allergies  Allergen Reactions  . Antihistamines, Diphenhydramine-Type Anaphylaxis   Current Outpatient Prescriptions on File Prior to Visit  Medication Sig Dispense Refill  . ibuprofen (ADVIL,MOTRIN) 200 MG tablet Take 200 mg by mouth every 6 (six) hours as needed for mild pain.     No current facility-administered  medications on file prior to visit.   Review of Systems Constitutional: Negative for increased diaphoresis, other activity, appetite or siginficant weight change other than noted HENT: Negative for worsening hearing loss, ear pain, facial swelling, mouth sores and neck stiffness.   Eyes: Negative for other worsening pain, redness or visual disturbance.  Respiratory: Negative for shortness of breath and wheezing  Cardiovascular: Negative for chest pain and palpitations.  Gastrointestinal: Negative for diarrhea, blood in stool, abdominal distention or other pain Genitourinary: Negative for hematuria, flank pain or change in urine volume.  Musculoskeletal: Negative for myalgias or other joint complaints.  Skin: Negative for color change and wound or drainage.  Neurological: Negative for syncope and numbness. other than noted Hematological: Negative for adenopathy. or other swelling Psychiatric/Behavioral: Negative for hallucinations, SI, self-injury, decreased concentration or other worsening agitation.      Objective:   Physical Exam BP 130/86 mmHg  Pulse 83  Temp(Src) 97.8 F (36.6 C) (Oral)  Ht 5\' 9"  (1.753 m)  Wt 264 lb (119.75 kg)  BMI 38.97 kg/m2  SpO2 98% VS noted,  Constitutional: Pt is oriented to person, place, and time. Appears well-developed and well-nourished, in no significant distress Head: Normocephalic and atraumatic.  Right Ear: External ear normal.  Left Ear: External ear normal.  Nose: Nose normal.  Mouth/Throat: Oropharynx is clear and moist.  Eyes: Conjunctivae and EOM are normal. Pupils are equal, round, and reactive to light.  Neck: Normal range of motion. Neck supple. No JVD present. No tracheal deviation present or significant neck LA or mass, mild tender  left lowest paracervical without rash, swelling Cardiovascular: Normal rate, regular rhythm, normal heart sounds and intact distal pulses.   Pulmonary/Chest: Effort normal and breath sounds without rales  or wheezing  Abdominal: Soft. Bowel sounds are normal. NT. No HSM  Musculoskeletal: Normal range of motion. Exhibits no edema.  Lymphadenopathy:  Has no cervical adenopathy.  Neurological: Pt is alert and oriented to person, place, and time. Pt has normal reflexes. No cranial nerve deficit. Motor  Intact except 4-4+/5LUE weakness, decreased sens to LT throughout, grip 4+/5 Skin: Skin is warm and dry. No rash noted.  Psychiatric:  Has normal mood and affect. Behavior is normal.     Assessment & Plan:

## 2015-07-19 NOTE — Progress Notes (Signed)
Pre visit review using our clinic review tool, if applicable. No additional management support is needed unless otherwise documented below in the visit note. 

## 2015-07-19 NOTE — Addendum Note (Signed)
Addended by: Anselm Jungling on: 07/19/2015 10:00 AM   Modules accepted: Orders

## 2015-07-19 NOTE — Patient Instructions (Addendum)
You had the Tdap tetanus shot today  Please take all new medication as prescribed - the prednisone  Please continue all other medications as before, and refills have been done if requested.  Please have the pharmacy call with any other refills you may need.  Please continue your efforts at being more active, low cholesterol diet, and weight control.  You are otherwise up to date with prevention measures today.  Please keep your appointments with your specialists as you may have planned  You will be contacted regarding the referral for: MRI for the neck, and orthopedic referral  Please go to the LAB in the Basement (turn left off the elevator) for the tests to be done today  You will be contacted by phone if any changes need to be made immediately.  Otherwise, you will receive a letter about your results with an explanation, but please check with MyChart first.  Please remember to sign up for MyChart if you have not done so, as this will be important to you in the future with finding out test results, communicating by private email, and scheduling acute appointments online when needed.  Please return in 1 year for your yearly visit, or sooner if needed, with Lab testing done 3-5 days before

## 2015-07-19 NOTE — Assessment & Plan Note (Signed)

## 2015-08-07 ENCOUNTER — Telehealth: Payer: Self-pay

## 2015-08-07 NOTE — Telephone Encounter (Signed)
Dahlia to contact pt; Insurance has denied MRI, but ask pt to continue to f/u with ortho as planned        ----- Message -----     From: Marshell Garfinkel     Sent: 08/06/2015  4:15 PM      To: [name], MD        Dr. Jonny Ruiz,    Per [employer], MRI c-spine does not meet medical necessity criteria for this patient. Please advise.        Pt advised in detail via personal VM

## 2015-08-21 ENCOUNTER — Telehealth: Payer: Self-pay | Admitting: Internal Medicine

## 2015-08-21 NOTE — Telephone Encounter (Signed)
Received medical records from [facility]. Sent to Dr. Jonny Ruiz. 08/21/15/ss

## 2015-09-12 ENCOUNTER — Encounter: Payer: Self-pay | Admitting: Emergency Medicine

## 2015-09-12 ENCOUNTER — Emergency Department: Payer: BLUE CROSS/BLUE SHIELD

## 2015-09-12 ENCOUNTER — Emergency Department
Admission: EM | Admit: 2015-09-12 | Discharge: 2015-09-12 | Disposition: A | Discharge status: Home / Self Care | Payer: BLUE CROSS/BLUE SHIELD | Attending: Emergency Medicine | Admitting: Emergency Medicine

## 2015-09-12 DIAGNOSIS — Y990 Civilian activity done for income or pay: Secondary | ICD-10-CM | POA: Diagnosis not present

## 2015-09-12 DIAGNOSIS — Z79899 Other long term (current) drug therapy: Secondary | ICD-10-CM | POA: Diagnosis not present

## 2015-09-12 DIAGNOSIS — W01198A Fall on same level from slipping, tripping and stumbling with subsequent striking against other object, initial encounter: Secondary | ICD-10-CM | POA: Insufficient documentation

## 2015-09-12 DIAGNOSIS — S0101XA Laceration without foreign body of scalp, initial encounter: Secondary | ICD-10-CM | POA: Diagnosis present

## 2015-09-12 DIAGNOSIS — Z720 Tobacco use: Secondary | ICD-10-CM | POA: Insufficient documentation

## 2015-09-12 DIAGNOSIS — Y9389 Activity, other specified: Secondary | ICD-10-CM | POA: Insufficient documentation

## 2015-09-12 DIAGNOSIS — Y9289 Other specified places as the place of occurrence of the external cause: Secondary | ICD-10-CM | POA: Diagnosis not present

## 2015-09-12 MED ORDER — LIDOCAINE HCL (PF) 1 % IJ SOLN
INTRAMUSCULAR | Status: AC
  Administered 2015-09-12: 5 mL via INTRADERMAL
  Filled 2015-09-12: qty 5

## 2015-09-12 MED ORDER — LIDOCAINE HCL (PF) 1 % IJ SOLN
5.0000 mL | Freq: Once | INTRAMUSCULAR | Status: AC
  Administered 2015-09-12: 5 mL via INTRADERMAL

## 2015-09-12 MED ORDER — IBUPROFEN 800 MG PO TABS: 800.0000 mg | ORAL_TABLET | Freq: Once | ORAL | Status: DC

## 2015-09-12 NOTE — ED Notes (Addendum)
Pt presetns to ED with laceration that is approx 2 cm to the back of his head. Pt states he was striopping a floor while at work and slipped falling back and hitting the back of his head on the floor. Pt denies loc or confusion; does report blurry vision to the left side of his visual field. Pt alert and calm with no increased work of breathing or acute distress noted at this time. Bleeding currently controlled.

## 2015-09-12 NOTE — ED Provider Notes (Signed)
[facility] Emergency Department Provider Note  ____________________________________________  Time seen: 1:15 AM  I have reviewed the triage vital signs and the nursing notes.   HISTORY  Chief Complaint Laceration      HPI Douglas Maxwell is a 34 y.o. male presents with history of accidental slip and fall Y stripping a floor at work today patient states he fell onto his back and hit the posterior portion of his head. Patient denies any LOC or confusion does admit to brief episode of blurred vision and current headache that is 5 out of 10. Patient denies any weakness or numbness and no gait instability.     Past Medical History  Diagnosis Date  . GERD (gastroesophageal reflux disease)     Patient Active Problem List   Diagnosis Date Noted  . Preventative health care 07/19/2015  . Left cervical radiculopathy 07/19/2015  . GERD (gastroesophageal reflux disease)     Past Surgical History  Procedure Laterality Date  . Tonsillectomy  1990    Current Outpatient Rx  Name  Route  Sig  Dispense  Refill  . ibuprofen (ADVIL,MOTRIN) 200 MG tablet   Oral   Take 200 mg by mouth every 6 (six) hours as needed for mild pain.         . predniSONE (DELTASONE) 10 MG tablet      3 tabs by mouth per day for 3 days,2tabs per day for 3 days,1tab per day for 3 days   18 tablet   0     Allergies Antihistamines, diphenhydramine-type  Family History  Problem Relation Age of Onset  . Cancer Mother     lung  . Diabetes Father   . COPD Father     Social History Social History  Substance Use Topics  . Smoking status: Current Every Day Smoker -- 1.00 packs/day    Types: Cigarettes  . Smokeless tobacco: None  . Alcohol Use: No    Review of Systems  Constitutional: Negative for fever. Eyes: Negative for visual changes. ENT: Negative for sore throat. Cardiovascular: Negative for chest pain. Respiratory: Negative for shortness of  breath. Gastrointestinal: Negative for abdominal pain, vomiting and diarrhea. Genitourinary: Negative for dysuria. Musculoskeletal: Negative for back pain. Skin: Negative for rash. Neurological: Positive for headaches, negative for focal weakness or numbness.  10-point ROS otherwise negative.  ____________________________________________   PHYSICAL EXAM:  VITAL SIGNS: ED Triage Vitals  Enc Vitals Group     BP 09/12/15 0049 131/89 mmHg     Pulse Rate 09/12/15 0049 78     Resp 09/12/15 0049 18     Temp 09/12/15 0049 97.9 F (36.6 C)     Temp Source 09/12/15 0049 Oral     SpO2 09/12/15 0049 97 %     Weight 09/12/15 0049 260 lb (117.935 kg)     Height 09/12/15 0049  (1.753 m)     Head Cir --      Peak Flow --      Pain Score 09/12/15 0049 5     Pain Loc --      Pain Edu? --      Excl. in GC? --     Constitutional: Alert and oriented. Well appearing and in no distress. Eyes: Conjunctivae are normal. PERRL. Normal extraocular movements. ENT   Head: Normocephalic and linear 2 cm laceration occipital scalp.   Nose: No congestion/rhinnorhea.   Mouth/Throat: Mucous membranes are moist.   Neck: No stridor. Hematological/Lymphatic/Immunilogical: No cervical lymphadenopathy. Cardiovascular:  Normal rate, regular rhythm. Normal and symmetric distal pulses are present in all extremities. No murmurs, rubs, or gallops. Respiratory: Normal respiratory effort without tachypnea nor retractions. Breath sounds are clear and equal bilaterally. No wheezes/rales/rhonchi. Gastrointestinal: Soft and nontender. No distention. There is no CVA tenderness. Genitourinary: deferred Musculoskeletal: Nontender with normal range of motion in all extremities. No joint effusions.  No lower extremity tenderness nor edema. Neurologic:  Normal speech and language. No gross focal neurologic deficits are appreciated. Speech is normal.  Skin:  Skin is warm, dry and intact. No rash  noted. Psychiatric: Mood and affect are normal. Speech and behavior are normal. Patient exhibits appropriate insight and judgment.    RADIOLOGY     CT Head Wo Contrast (Final result) Result time: 09/12/15 01:40:07   Final result by Rad Results In Interface (09/12/15 01:40:07)   Narrative:   CLINICAL DATA: Posterior head laceration after slip and fall at work, striking back of head. No loss of consciousness. LEFT blurry vision.  EXAM: CT HEAD WITHOUT CONTRAST  TECHNIQUE: Contiguous axial images were obtained from the base of the skull through the vertex without intravenous contrast.  COMPARISON: None.  FINDINGS: The ventricles and sulci are normal. No intraparenchymal hemorrhage, mass effect nor midline shift. No acute large vascular territory infarcts.  No abnormal extra-axial fluid collections. Basal cisterns are patent.  Small to moderate RIGHT parietal scalp hematoma without subcutaneous gas or radiopaque foreign bodies. No skull fracture. The included ocular globes and orbital contents are non-suspicious. Moderate paranasal sinus mucosal thickening, partially imaged. The mastoid air cells are well aerated.  IMPRESSION: Small to moderate RIGHT parietal scalp hematoma. No skull fracture.  Normal noncontrast CT head.   Electronically Signed By: Awilda Metroourtnay Bloomer M.D. On: 09/12/2015 01:40        ECG Results      ____________________________________________   PROCEDURES  Procedure(s) performed: LACERATION REPAIR Performed by: Darci CurrentBROWN, Troup N Authorized by: Darci CurrentBROWN, Platte N Consent: Verbal consent obtained. Risks and benefits: risks, benefits and alternatives were discussed Consent given by: patient Patient identity confirmed: provided demographic data Prepped and Draped in normal sterile fashion Wound explored  Laceration Location: occipital scalp  Laceration Length: 2cm  No Foreign Bodies seen or palpated  Anesthesia: local  infiltration  Local anesthetic: lidocaine 1%   Anesthetic total: 5 ml  Irrigation method: syringe Amount staples of cleaning: standard  Skin closure:   Number of staples: 4    Patient tolerance: Patient tolerated the procedure well with no immediate complications.   ____________________________________________   INITIAL IMPRESSION / ASSESSMENT AND PLAN / ED COURSE  Pertinent labs & imaging results that were available during my care of the patient were reviewed by me and considered in my medical decision making (see chart for details).   ____________________________________________   FINAL CLINICAL IMPRESSION(S) / ED DIAGNOSES  Final diagnoses:  Scalp laceration, initial encounter      Darci Currentandolph N Addelynn Batte, MD 09/14/15 [phone]

## 2015-09-12 NOTE — Discharge Instructions (Signed)
Stitches, Staples, or Adhesive Wound Closure  °Health care providers use stitches (sutures), staples, and certain glue (skin adhesives) to hold skin together while it heals (wound closure). You may need this treatment after you have surgery or if you cut your skin accidentally. These methods help your skin to heal more quickly and make it less likely that you will have a scar. A wound may take several months to heal completely.  °The type of wound you have determines when your wound gets closed. In most cases, the wound is closed as soon as possible (primary skin closure). Sometimes, closure is delayed so the wound can be cleaned and allowed to heal naturally. This reduces the chance of infection. Delayed closure may be needed if your wound:  °Is caused by a bite.  °Happened more than 6 hours ago.  °Involves loss of skin or the tissues under the skin.  °Has dirt or debris in it that cannot be removed.  °Is infected. °WHAT ARE THE DIFFERENT KINDS OF WOUND CLOSURES?  °There are many options for wound closure. The one that your health care provider uses depends on how deep and how large your wound is.  °Adhesive Glue  °To use this type of glue to close a wound, your health care provider holds the edges of the wound together and paints the glue on the surface of your skin. You may need more than one layer of glue. Then the wound may be covered with a light bandage (dressing).  °This type of skin closure may be used for small wounds that are not deep (superficial). Using glue for wound closure is less painful than other methods. It does not require a medicine that numbs the area (local anesthetic). This method also leaves nothing to be removed. Adhesive glue is often used for children and on facial wounds.  °Adhesive glue cannot be used for wounds that are deep, uneven, or bleeding. It is not used inside of a wound.  °Adhesive Strips  °These strips are made of sticky (adhesive), porous paper. They are applied across your  skin edges like a regular adhesive bandage. You leave them on until they fall off.  °Adhesive strips may be used to close very superficial wounds. They may also be used along with sutures to improve the closure of your skin edges.  °Sutures  °Sutures are the oldest method of wound closure. Sutures can be made from natural substances, such as silk, or from synthetic materials, such as nylon and steel. They can be made from a material that your body can break down as your wound heals (absorbable), or they can be made from a material that needs to be removed from your skin (nonabsorbable). They come in many different strengths and sizes.  °Your health care provider attaches the sutures to a steel needle on one end. Sutures can be passed through your skin, or through the tissues beneath your skin. Then they are tied and cut. Your skin edges may be closed in one continuous stitch or in separate stitches.  °Sutures are strong and can be used for all kinds of wounds. Absorbable sutures may be used to close tissues under the skin. The disadvantage of sutures is that they may cause skin reactions that lead to infection. Nonabsorbable sutures need to be removed.  °Staples  °When surgical staples are used to close a wound, the edges of your skin on both sides of the wound are brought close together. A staple is placed across the wound, and   an instrument secures the edges together. Staples are often used to close surgical cuts (incisions).  °Staples are faster to use than sutures, and they cause less skin reaction. Staples need to be removed using a tool that bends the staples away from your skin.  °HOW DO I CARE FOR MY WOUND CLOSURE?  °Take medicines only as directed by your health care provider.  °If you were prescribed an antibiotic medicine for your wound, finish it all even if you start to feel better.  °Use ointments or creams only as directed by your health care provider.  °Wash your hands with soap and water before and  after touching your wound.  °Do not soak your wound in water. Do not take baths, swim, or use a hot tub until your health care provider approves.  °Ask your health care provider when you can start showering. Cover your wound if directed by your health care provider.  °Do not take out your own sutures or staples.  °Do not pick at your wound. Picking can cause an infection.  °Keep all follow-up visits as directed by your health care provider. This is important. °HOW LONG WILL I HAVE MY WOUND CLOSURE?  °Leave adhesive glue on your skin until the glue peels away.  °Leave adhesive strips on your skin until the strips fall off.  °Absorbable sutures will dissolve within several days.  °Nonabsorbable sutures and staples must be removed. The location of the wound will determine how long they stay in. This can range from several days to a couple of weeks. °WHEN SHOULD I SEEK HELP FOR MY WOUND CLOSURE?  °Contact your health care provider if:  °You have a fever.  °You have chills.  °You have drainage, redness, swelling, or pain at your wound.  °There is a bad smell coming from your wound.  °The skin edges of your wound start to separate after your sutures have been removed.  °Your wound becomes thick, raised, and darker in color after your sutures come out (scarring). °This information is not intended to replace advice given to you by your health care provider. Make sure you discuss any questions you have with your health care provider.  °Document Released: 07/29/2001 Document Revised: 11/24/2014 Document Reviewed: 04/12/2014  °Elsevier Interactive Patient Education ©2016 Elsevier Inc[employer]  ° °

## 2015-09-20 ENCOUNTER — Emergency Department
Admission: EM | Admit: 2015-09-20 | Discharge: 2015-09-20 | Disposition: A | Discharge status: Home / Self Care | Payer: Worker's Compensation | Attending: Student | Admitting: Student

## 2015-09-20 DIAGNOSIS — Z4801 Encounter for change or removal of surgical wound dressing: Secondary | ICD-10-CM | POA: Insufficient documentation

## 2015-09-20 DIAGNOSIS — Z7952 Long term (current) use of systemic steroids: Secondary | ICD-10-CM | POA: Diagnosis not present

## 2015-09-20 DIAGNOSIS — Z720 Tobacco use: Secondary | ICD-10-CM | POA: Diagnosis not present

## 2015-09-20 DIAGNOSIS — Z4802 Encounter for removal of sutures: Secondary | ICD-10-CM

## 2015-09-20 NOTE — ED Provider Notes (Signed)
[facility] Emergency Department Provider Note  ____________________________________________  Time seen: Approximately 7:05 AM  I have reviewed the triage vital signs and the nursing notes.   HISTORY  Chief Complaint Suture / Staple Removal   HPI Douglas Maxwell is a 34 y.o. male is here to have staples removed from a scalp wound.Patient denies having any problems. Currently he denies any pain.   Past Medical History  Diagnosis Date  . GERD (gastroesophageal reflux disease)     Patient Active Problem List   Diagnosis Date Noted  . Preventative health care 07/19/2015  . Left cervical radiculopathy 07/19/2015  . GERD (gastroesophageal reflux disease)     Past Surgical History  Procedure Laterality Date  . Tonsillectomy  1990    Current Outpatient Rx  Name  Route  Sig  Dispense  Refill  . ibuprofen (ADVIL,MOTRIN) 200 MG tablet   Oral   Take 200 mg by mouth every 6 (six) hours as needed for mild pain.         . predniSONE (DELTASONE) 10 MG tablet      3 tabs by mouth per day for 3 days,2tabs per day for 3 days,1tab per day for 3 days   18 tablet   0     Allergies Antihistamines, diphenhydramine-type  Family History  Problem Relation Age of Onset  . Cancer Mother     lung  . Diabetes Father   . COPD Father     Social History Social History  Substance Use Topics  . Smoking status: Current Every Day Smoker -- 1.00 packs/day    Types: Cigarettes  . Smokeless tobacco: Not on file  . Alcohol Use: No    Review of Systems Constitutional: No fever/chills Cardiovascular: Denies chest pain. Respiratory: Denies shortness of breath. Gastrointestinal:   No nausea, no vomiting.  Skin: Negative for rash. Staples presents scalp Neurological: Negative for headaches, focal weakness or numbness.  10-point ROS otherwise negative.  ____________________________________________   PHYSICAL EXAM:  VITAL SIGNS: ED Triage Vitals  Enc  Vitals Group     BP 09/20/15 0642 116/79 mmHg     Pulse Rate 09/20/15 0642 78     Resp 09/20/15 0642 18     Temp 09/20/15 0642 98.1 F (36.7 C)     Temp Source 09/20/15 0642 Oral     SpO2 09/20/15 0642 96 %     Weight 09/20/15 0642 240 lb (108.863 kg)     Height 09/20/15 0642  (1.753 m)     Head Cir --      Peak Flow --      Pain Score 09/20/15 0643 0     Pain Loc --      Pain Edu? --      Excl. in GC? --     Constitutional: Alert and oriented. Well appearing and in no acute distress. Eyes: Conjunctivae are normal. PERRL. EOMI. Head: Atraumatic. Nose: No congestion/rhinnorhea. Neck: No stridor.   Cardiovascular: Normal rate, regular rhythm. Grossly normal heart sounds.  Good peripheral circulation. Respiratory: Normal respiratory effort.  No retractions. Lungs CTAB. Gastrointestinal: Soft and nontender. No distention. Musculoskeletal: Moves upper and lower extremities without any difficulty. Neurologic:  Normal speech and language. No gross focal neurologic deficits are appreciated. No gait instability. Skin:  Skin is warm, dry and intact. No rash noted. Psychiatric: Mood and affect are normal. Speech and behavior are normal.  ____________________________________________   LABS (all labs ordered are listed, but only abnormal results are  displayed)  Labs Reviewed - No data to display  PROCEDURES  Procedure(s) performed: Staples were removed by myself without any difficulty.  Critical Care performed: No  ____________________________________________   INITIAL IMPRESSION / ASSESSMENT AND PLAN / ED COURSE  Pertinent labs & imaging results that were available during my care of the patient were reviewed by me and considered in my medical decision making (see chart for details).  Patient is return to the emergency room if any urgent concerns. ____________________________________________   FINAL CLINICAL IMPRESSION(S) / ED DIAGNOSES  Final diagnoses:  Encounter  for removal of staples      Tommi RumpsRhonda L Qiana Landgrebe, PA-C 09/20/15 [id number]  Gayla DossEryka A Gayle, MD 09/21/15 0000

## 2015-09-20 NOTE — ED Notes (Signed)
Here for staple removal from wound to head.  States no problems, wound healing well.

## 2015-09-20 NOTE — Discharge Instructions (Signed)
Wound Closure Removal °The staples, stitches, or skin adhesives that were used to close your skin have been removed. You will need to continue the care described here until the wound is completely healed and your health care provider confirms that wound care can be stopped. °HOW DO I CARE FOR MY WOUND? °How you care for your wound after the wound closure has been removed depends on the kind of wound closure you had. °Stitches or Staples °· Keep the wound site dry and clean. Do not soak it in water. °· If skin adhesive strips were applied after the staples were removed, they will begin to peel off in a few days. Allow them to remain in place until they fall off on their own. °· If you still have a bandage (dressing), change it at least once a day or as directed by your health care provider. If the dressing sticks, pour warm, sterile water over it until it loosens and can be removed without pulling apart the wound edges. Pat the area dry with a soft, clean towel. Do not rub the wound because that may cause bleeding. °· Apply cream or ointment that stops the growth of bacteria (antibacterial cream or antibacterial ointment) only if your health care provider has directed you to do so. °· Place a nonstick bandage over the wound to prevent the dressing from sticking. °· Cover the nonstick bandage with a new dressing as directed by your health care provider. °· If the bandage becomes wet or dirty or it develops a bad smell, change it as soon as possible. °· Take medicines only as directed by your health care provider. °Adhesive Strips or Glue °· Adhesive strips and glue peel off on their own. °· Leave adhesive strips and glue in place until they fall off. °ARE THERE ANY BATHING RESTRICTIONS ONCE MY WOUND CLOSURE IS REMOVED? °Do not take baths, swim, or use a hot tub until your health care provider approves. °HOW CAN I DECREASE THE SIZE OF MY SCAR? °How your scar heals and the size of your scar depend on many factors, such  as your age, the type of scar you have, and genetic factors. The following may help decrease the size of your scar: °· Sunscreen. Use sunscreen with a sun protection factor (SPF) of at least 15 when out in the sun. Reapply the sunscreen every two hours. °· Friction massage. Once your wound is completely healed, you can gently massage the scarred area. This can decrease scar thickness. °WHEN SHOULD I SEEK HELP?  °Seek help if: °· You have a fever. °· You have chills. °· You have drainage, redness, swelling, or pain at your wound. °· There is a bad smell coming from your wound. °· Your wound edges open up or do not stay closed after the wound closure has been removed. °  °This information is not intended to replace advice given to you by your health care provider. Make sure you discuss any questions you have with your health care provider. °  °Document Released: 10/16/2008 Document Revised: 11/24/2014 Document Reviewed: 03/21/2014 °Elsevier Interactive Patient Education ©2016 Elsevier Inc. ° °

## 2016-01-25 ENCOUNTER — Ambulatory Visit: Payer: Self-pay | Admitting: Internal Medicine

## 2016-04-16 ENCOUNTER — Ambulatory Visit (INDEPENDENT_AMBULATORY_CARE_PROVIDER_SITE_OTHER): Payer: BLUE CROSS/BLUE SHIELD | Admitting: Internal Medicine

## 2016-04-16 ENCOUNTER — Encounter: Payer: Self-pay | Admitting: Internal Medicine

## 2016-04-16 VITALS — BP 130/72 | HR 89 | Temp 98.4°F | Resp 20 | Wt 251.0 lb

## 2016-04-16 DIAGNOSIS — M25572 Pain in left ankle and joints of left foot: Secondary | ICD-10-CM

## 2016-04-16 MED ORDER — IBUPROFEN 600 MG PO TABS: 600.0000 mg | ORAL_TABLET | Freq: Three times a day (TID) | ORAL | Status: DC | PRN

## 2016-04-16 MED ORDER — PREDNISONE 10 MG PO TABS: ORAL_TABLET | ORAL | Status: DC

## 2016-04-16 NOTE — Progress Notes (Signed)
   Subjective:    Patient ID: Douglas Maxwell, male    DOB: 1981/06/06, 35 y.o.   MRN: [id number]  HPI  Here with co subacute onset several months left medial ankle pain, now worse pain and swelling in the past wk,radiates to the medial left foot, mod to severe, constant, sharp, worse to stand and walk up to 12-15 miles per day for work purpose daily, better to sit and some better swelling early in the am, but stiffness worse anyway in the AM.  No trauma, fever or hx of gout. Pt denies chest pain, increased sob or doe, wheezing, orthopnea, PND, increased LE swelling, palpitations, dizziness or syncope. Past Medical History  Diagnosis Date  . GERD (gastroesophageal reflux disease)    Past Surgical History  Procedure Laterality Date  . Tonsillectomy  1990    reports that he has been smoking Cigarettes.  He has been smoking about 1.00 pack per day. He does not have any smokeless tobacco history on file. He reports that he does not drink alcohol or use illicit drugs. family history includes COPD in his father; Cancer in his mother; Diabetes in his father. Allergies  Allergen Reactions  . Antihistamines, Diphenhydramine-Type Anaphylaxis   No current outpatient prescriptions on file prior to visit.   No current facility-administered medications on file prior to visit.   Review of Systems  All otherwise neg per pt        Objective:   Physical Exam BP 130/72 mmHg  Pulse 89  Temp(Src) 98.4 F (36.9 C) (Oral)  Resp 20  Wt 251 lb (113.853 kg)  SpO2 97% VS noted,  Constitutional: Pt appears in no apparent distress HENT: Head: NCAT.  Right Ear: External ear normal.  Left Ear: External ear normal.  Eyes: . Pupils are equal, round, and reactive to light. Conjunctivae and EOM are normal Neck: Normal range of motion. Neck supple.  Cardiovascular: Normal rate and regular rhythm.   Pulmonary/Chest: Effort normal and breath sounds without rales or wheezing.  LLE neurovasc intact but has  nondiscrete but large diffuse medial only distal leg/ankle swelling/tenderness along the tarsal tunnell without erythema, red streaks or joint effusion Neurological: Pt is alert. Not confused , motor grossly intact Skin: Skin is warm. No rash, no LE edema Psychiatric: Pt behavior is normal. No agitation.     Assessment & Plan:

## 2016-04-16 NOTE — Progress Notes (Signed)
Pre visit review using our clinic review tool, if applicable. No additional management support is needed unless otherwise documented below in the visit note. 

## 2016-04-16 NOTE — Assessment & Plan Note (Signed)
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

## 2016-04-16 NOTE — Patient Instructions (Signed)
Please take all new medication as prescribed- the prednisone, as well as the ibuprofen as needed for pain  Please wear an ankle brace for at least 1 wk until healed  Please continue all other medications as before, and refills have been done if requested.  Please have the pharmacy call with any other refills you may need.  Please continue your efforts at being more active, low cholesterol diet, and weight control.  Please keep your appointments with your specialists as you may have planned  You will be contacted regarding the referral for: Dr Katrinka BlazingSmith - sports medicine in this office

## 2016-05-15 ENCOUNTER — Ambulatory Visit (INDEPENDENT_AMBULATORY_CARE_PROVIDER_SITE_OTHER): Payer: BLUE CROSS/BLUE SHIELD | Admitting: Family Medicine

## 2016-05-15 ENCOUNTER — Encounter: Payer: Self-pay | Admitting: Family Medicine

## 2016-05-15 ENCOUNTER — Other Ambulatory Visit: Payer: Self-pay

## 2016-05-15 VITALS — BP 126/84 | HR 72 | Ht 69.0 in | Wt 253.0 lb

## 2016-05-15 DIAGNOSIS — M76822 Posterior tibial tendinitis, left leg: Secondary | ICD-10-CM | POA: Insufficient documentation

## 2016-05-15 DIAGNOSIS — M25572 Pain in left ankle and joints of left foot: Secondary | ICD-10-CM

## 2016-05-15 DIAGNOSIS — M216X2 Other acquired deformities of left foot: Secondary | ICD-10-CM

## 2016-05-15 DIAGNOSIS — M216X9 Other acquired deformities of unspecified foot: Secondary | ICD-10-CM | POA: Insufficient documentation

## 2016-05-15 MED ORDER — DICLOFENAC SODIUM 2 % TD SOLN: 2.0000 "application " | Freq: Two times a day (BID) | TRANSDERMAL | Status: DC

## 2016-05-15 NOTE — Progress Notes (Signed)
Pre visit review using our clinic review tool, if applicable. No additional management support is needed unless otherwise documented below in the visit note. 

## 2016-05-15 NOTE — Assessment & Plan Note (Signed)
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 We discussed icing regimen, home exercises, which activities to do in which ones to avoid. We discussed over-the-counter orthotics. Prescription for topical anti-inflammatories given. We discussed patient should avoid being barefoot. Discussed the prognosis of this as well as the long-term complications with patient having a flatfoot. Patient will come back and see me again in 4 weeks. If continuing have pain we'll consider injection as well as referral to formal physical therapy.

## 2016-05-15 NOTE — Patient Instructions (Signed)
Good to see you  Ice is your friend Ice 20 minutes 2 times daily. Usually after activity and before bed. Exercises 3 times a week.  pennsaid pinkie amount topically 2 times daily as needed.  Vitamin D 2000 IUdaily  Turmeric 500mg  twice daily  Avoid being barefoot.  Spenco orthotics "total support" online would be great  Good shoes with rigid bottom.  Dierdre HarnessKeen, Dansko, Merrell or New balance greater then 700 See me again in 4 weeks to make sure we are making progress.

## 2016-05-15 NOTE — Assessment & Plan Note (Signed)
Discussed over-the-counter orthotics, proper shoes, home exercises given as well. Follow-up 4 weeks

## 2016-05-15 NOTE — Progress Notes (Signed)
Douglas Maxwell D.O. [facility] [address] [place], [place] [address] Phone: [phone] Subjective:    I'm seeing this patient by the request  of:  Oliver BarreJames John, MD   CC: left ankle pain  BJY:NWGNFAOZHYHPI:Subjective [name] is a 35 y.o. male coming in with complaint of left ankle pain.  Has been going on months. Patient remembers being at a wedding and rolled his ankle. Mild swellingn initiailly now worse with standing for long amount of time. Patient states that it seems to be more on the medial aspect of the left ankle. Never hurt this ankle previously. Patient describes it as more of a dull ache that sometimes can have a sharp pain. States that the swelling has completely resolved but the pain is still there. Denies any numbness. Denies any weakness. Next the difficulties he has to stand on his feet when he works.    Past Medical History  Diagnosis Date  . GERD (gastroesophageal reflux disease)    Past Surgical History  Procedure Laterality Date  . Tonsillectomy  1990   Social History   Social History  . Marital Status: Single    Spouse Name: N/A  . Number of Children: N/A  . Years of Education: N/A   Social History Main Topics  . Smoking status: Current Every Day Smoker -- 1.00 packs/day    Types: Cigarettes  . Smokeless tobacco: None  . Alcohol Use: No  . Drug Use: No  . Sexual Activity: Not Asked   Other Topics Concern  . None   Social History Narrative   Allergies  Allergen Reactions  . Antihistamines, Diphenhydramine-Type Anaphylaxis   Family History  Problem Relation Age of Onset  . Cancer Mother     lung  . Diabetes Father   . COPD Father     Past medical history, social, surgical and family history all reviewed in electronic medical record.  No pertanent information unless stated regarding to the chief complaint.   Review of Systems: No headache, visual changes, nausea, vomiting, diarrhea, constipation, dizziness, abdominal pain, skin rash,  fevers, chills, night sweats, weight loss, swollen lymph nodes, body aches, joint swelling, muscle aches, chest pain, shortness of breath, mood changes.   Objective Blood pressure 126/84, pulse 72, height 5\' 9"  (1.753 m), weight 253 lb (114.76 kg), SpO2 97 %.  General: No apparent distress alert and oriented x3 mood and affect normal, dressed appropriately.  HEENT: Pupils equal, extraocular movements intact  Respiratory: Patient's speak in full sentences and does not appear short of breath  Cardiovascular: No lower extremity edema, non tender, no erythema  Skin: Warm dry intact with no signs of infection or rash on extremities or on axial skeleton.  Abdomen: Soft nontender  Neuro: Cranial nerves II through XII are intact, neurovascularly intact in all extremities with 2+ DTRs and 2+ pulses.  Lymph: No lymphadenopathy of posterior or anterior cervical chain or axillae bilaterally.  Gait normal with good balance and coordination.  MSK:  Non tender with full range of motion and good stability and symmetric strength and tone of shoulders, elbows, wrist, hip, knee and bilaterally.  Ankle:left No visible erythema or swelling. Range of motion is full in all directions. Strength is 5/5 in all directions. Stable lateral and medial ligaments; squeeze test and kleiger test unremarkable; Talar dome nontender; No pain at base of 5th MT; No tenderness over cuboid; No tenderness over N spot or navicular prominence Tender over the posterior tibialis tendon just inferior to the  medial malleolus No sign of peroneal tendon subluxations or tenderness to palpation Negative tarsal tunnel tinel's Able to walk 4 steps. Contralateral ankle unremarkable.  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 No splaying of the toes. Minimal breakdown of the transverse arch.  MSK US performed of: left ankle This study was ordered, performed, and interpreted  by Terrilee FilesZach Maxwell D.O.  Foot/Ankle:   All structures visualized.   Talar dome unremarkable  Ankle mortise without effusion. Peroneus longus and brevis tendons unremarkable on long and transverse views without sheath effusions. Posterior tibial tendon does have hypoechoic changes. No true tear appreciated. No avulsion fracture off of the navicular bone. Mild increase in Doppler flow. Achilles tendon visualized along length of tendon and unremarkable on long and transverse views without sheath effusion. Anterior Talofibular Ligament and Calcaneofibular Ligaments unremarkable and intact. Deltoid Ligament unremarkable and intact. .  IMPRESSION:  posterior tibialis tendinitis     Impression and Recommendations:     This case required medical decision making of moderate complexity.      Note: This dictation was prepared with Dragon dictation along with smaller phrase technology. Any transcriptional errors that result from this process are unintentional.

## 2016-06-11 NOTE — Progress Notes (Signed)
[name] [address] [place], [place] [address] Phone: [phone] Subjective:    I'm seeing this patient by the request  of:  Oliver Barre, MD   CC: left ankle pain follow-up   NGE:[id number]  Douglas Maxwell is a 35 y.o. male coming in with complaint of left ankle pain.  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 Significant pronation deformity of the feet as well. Patient was to try home exercises, icing as well as over-the-counter orthotics. Patient states 70% better, started standing or e at work, and worsening pain.  More swelling again.  Wans to avoid that and has talked to boss.  Doing the exercises fairly frequently.     Past Medical History:  Diagnosis Date  . GERD (gastroesophageal reflux disease)    Past Surgical History:  Procedure Laterality Date  . TONSILLECTOMY  1990   Social History   Social History  . Marital status: Single    Spouse name: N/A  . Number of children: N/A  . Years of education: N/A   Social History Main Topics  . Smoking status: Current Every Day Smoker    Packs/day: 1.00    Types: Cigarettes  . Smokeless tobacco: Not on file  . Alcohol use No  . Drug use: No  . Sexual activity: Not on file   Other Topics Concern  . Not on file   Social History Narrative  . No narrative on file   Allergies  Allergen Reactions  . Antihistamines, Diphenhydramine-Type Anaphylaxis   Family History  Problem Relation Age of Onset  . Cancer Mother     lung  . Diabetes Father   . COPD Father     Past medical history, social, surgical and family history all reviewed in electronic medical record.  No pertanent information unless stated regarding to the chief complaint.   Review of Systems: No headache, visual changes, nausea, vomiting, diarrhea, constipation, dizziness, abdominal pain, skin rash, fevers, chills, night sweats, weight loss, swollen lymph nodes, body  aches, joint swelling, muscle aches, chest pain, shortness of breath, mood changes.   Objective  Blood pressure 122/78, pulse 74, weight 255 lb (115.7 kg).  General: No apparent distress alert and oriented x3 mood and affect normal, dressed appropriately.  HEENT: Pupils equal, extraocular movements intact  Respiratory: Patient's speak in full sentences and does not appear short of breath  Cardiovascular: No lower extremity edema, non tender, no erythema  Skin: Warm dry intact with no signs of infection or rash on extremities or on axial skeleton.  Abdomen: Soft nontender  Neuro: Cranial nerves II through XII are intact, neurovascularly intact in all extremities with 2+ DTRs and 2+ pulses.  Lymph: No lymphadenopathy of posterior or anterior cervical chain or axillae bilaterally.  Gait normal with good balance and coordination.  MSK:  Non tender with full range of motion and good stability and symmetric strength and tone of shoulders, elbows, wrist, hip, knee and bilaterally.  Ankle:left No visible erythema or swelling. Range of motion is full in all directions. Strength is 5/5 in all directions. Stable lateral and medial ligaments; squeeze test and kleiger test unremarkable; Talar dome nontender; No pain at base of 5th MT; No tenderness over cuboid; No tenderness over N spot or navicular prominence Continued mild to moderate tenderness over the PTT.  No sign of peroneal tendon subluxations or tenderness to palpation Negative tarsal tunnel tinel's Able to walk 4 steps. Contralateral ankle  unremarkable.  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 No splaying of the toes. Minimal breakdown of the transverse arch.  IMPRESSION:  posterior tibialis tendinitis   Procedure: Real-time Ultrasound Guided Injection of left posterior tib tendon sheath.  Device: GE Logiq E  Ultrasound guided injection is preferred based studies  that show increased duration, increased effect, greater accuracy, decreased procedural pain, increased response rate, and decreased cost with ultrasound guided versus blind injection.  Verbal informed consent obtained.  Time-out conducted.  Noted no overlying erythema, induration, or other signs of local infection.  Skin prepped in a sterile fashion.  Local anesthesia: Topical Ethyl chloride.  With sterile technique and under real time ultrasound guidance:  With a 25-gauge half-inch needle patient was injected with a total of 0.5 mL of 0.5% Marcaine and 0.5 mL of Kenalog 40 mg/dL Completed without difficulty  Pain immediately resolved suggesting accurate placement of the medication.  Advised to call if fevers/chills, erythema, induration, drainage, or persistent bleeding.  Images permanently stored and available for review in the ultrasound unit.  Impression: Technically successful ultrasound guided injection.      Impression and Recommendations:     This case required medical decision making of moderate complexity.      Note: This dictation was prepared with Dragon dictation along with smaller phrase technology. Any transcriptional errors that result from this process are unintentional.

## 2016-06-12 ENCOUNTER — Other Ambulatory Visit: Payer: Self-pay

## 2016-06-12 ENCOUNTER — Ambulatory Visit (INDEPENDENT_AMBULATORY_CARE_PROVIDER_SITE_OTHER): Payer: BLUE CROSS/BLUE SHIELD | Admitting: Family Medicine

## 2016-06-12 VITALS — BP 122/78 | HR 74 | Wt 255.0 lb

## 2016-06-12 DIAGNOSIS — M76822 Posterior tibial tendinitis, left leg: Secondary | ICD-10-CM

## 2016-06-12 DIAGNOSIS — M25572 Pain in left ankle and joints of left foot: Secondary | ICD-10-CM

## 2016-06-12 DIAGNOSIS — M216X2 Other acquired deformities of left foot: Secondary | ICD-10-CM

## 2016-06-12 NOTE — Assessment & Plan Note (Signed)
Encouraged patient to get orthotics and proper shoes.

## 2016-06-12 NOTE — Assessment & Plan Note (Signed)
Patient given injection and tolerated the procedure well. Patient will continue with the topical antibiotic laboratories, home exercises and icing. Decline formal physical therapy. Patient will come back and see me again in 4 weeks for further evaluation

## 2016-06-12 NOTE — Patient Instructions (Signed)
Good to se eyou  Still avoid being barefoot.  Wear good shoes Injection should help  The orthotics will be key Ice still after a lot of activity  See me again in 4 weeks if not perfect or see me when you need me.

## 2016-07-15 NOTE — Progress Notes (Signed)
Zach Kenedi Cilia D.O. [facility] [address] [place], [place] [address] Phone: [phone] Subjective:    I'm seeing this patient by the request  of:  Oliver BarreJames John, MD   CC: left ankle pain follow-up   BJY:NWGNFAOZHYHPI:Subjective  Douglas Maxwell is a 35 y.o. male coming in with complaint of left ankle pain.  Patient's was diagnosed previously with a posterior tibialis tendinitis. Patient was given an injection at last exam. Patient was to continue home exercises icing protocol as well as topical anti-inflammatories. Patient states 95% better. Some mild discomfort from time to time. His lungs he does well with the shoes and the home exercises he seems to be doing relatively well. No new symptoms.  Past Medical History:  Diagnosis Date  . GERD (gastroesophageal reflux disease)    Past Surgical History:  Procedure Laterality Date  . TONSILLECTOMY  1990   Social History   Social History  . Marital status: Single    Spouse name: N/A  . Number of children: N/A  . Years of education: N/A   Social History Main Topics  . Smoking status: Current Every Day Smoker    Packs/day: 1.00    Types: Cigarettes  . Smokeless tobacco: None  . Alcohol use No  . Drug use: No  . Sexual activity: Not Asked   Other Topics Concern  . None   Social History Narrative  . None   Allergies  Allergen Reactions  . Antihistamines, Diphenhydramine-Type Anaphylaxis   Family History  Problem Relation Age of Onset  . Cancer Mother     lung  . Diabetes Father   . COPD Father     Past medical history, social, surgical and family history all reviewed in electronic medical record.  No pertanent information unless stated regarding to the chief complaint.   Review of Systems: No headache, visual changes, nausea, vomiting, diarrhea, constipation, dizziness, abdominal pain, skin rash, fevers, chills, night sweats, weight loss, swollen lymph nodes, body aches, joint swelling, muscle aches, chest pain,  shortness of breath, mood changes.   Objective  Blood pressure 122/80, pulse 77, weight 260 lb (117.9 kg), SpO2 97 %.  General: No apparent distress alert and oriented x3 mood and affect normal, dressed appropriately.  HEENT: Pupils equal, extraocular movements intact  Respiratory: Patient's speak in full sentences and does not appear short of breath  Cardiovascular: No lower extremity edema, non tender, no erythema  Skin: Warm dry intact with no signs of infection or rash on extremities or on axial skeleton.  Abdomen: Soft nontender  Neuro: Cranial nerves II through XII are intact, neurovascularly intact in all extremities with 2+ DTRs and 2+ pulses.  Lymph: No lymphadenopathy of posterior or anterior cervical chain or axillae bilaterally.  Gait normal with good balance and coordination.  MSK:  Non tender with full range of motion and good stability and symmetric strength and tone of shoulders, elbows, wrist, hip, knee and bilaterally.  Ankle:left No visible erythema or swelling. Range of motion is full in all directions. Strength is 5/5 in all directions. Stable lateral and medial ligaments; squeeze test and kleiger test unremarkable; Talar dome nontender; No pain at base of 5th MT; No tenderness over cuboid; No tenderness over N spot or navicular prominence Continued mild to moderate tenderness over the PTT.  No sign of peroneal tendon subluxations or tenderness to palpation Negative tarsal tunnel tinel's Able to walk 4 steps. Contralateral ankle unremarkable.  Foot exam shows the patient does have severe over  pronation of the hindfoot on the medial longitudinal arch. No splaying of the toes.      Impression and Recommendations:     This case required medical decision making of moderate complexity.      Note: This dictation was prepared with Dragon dictation along with smaller phrase technology. Any transcriptional errors that result from this process are unintentional.

## 2016-07-16 ENCOUNTER — Ambulatory Visit (INDEPENDENT_AMBULATORY_CARE_PROVIDER_SITE_OTHER): Payer: BLUE CROSS/BLUE SHIELD | Admitting: Family Medicine

## 2016-07-16 ENCOUNTER — Encounter: Payer: Self-pay | Admitting: Family Medicine

## 2016-07-16 DIAGNOSIS — M76822 Posterior tibial tendinitis, left leg: Secondary | ICD-10-CM | POA: Diagnosis not present

## 2016-07-16 NOTE — Assessment & Plan Note (Signed)
Doing significantly better one month out from the injection. Encourage him to continue home exercises and icing pedicle. Has topical anti-inflammatories as needed. Follow-up as needed.

## 2016-09-30 ENCOUNTER — Ambulatory Visit (INDEPENDENT_AMBULATORY_CARE_PROVIDER_SITE_OTHER): Payer: BLUE CROSS/BLUE SHIELD | Admitting: Internal Medicine

## 2016-09-30 ENCOUNTER — Encounter: Payer: Self-pay | Admitting: Internal Medicine

## 2016-09-30 DIAGNOSIS — G5712 Meralgia paresthetica, left lower limb: Secondary | ICD-10-CM

## 2016-09-30 NOTE — Progress Notes (Signed)
Pre visit review using our clinic review tool, if applicable. No additional management support is needed unless otherwise documented below in the visit note. 

## 2016-09-30 NOTE — Progress Notes (Signed)
   Subjective:    Patient ID: Douglas Maxwell, male    DOB: 1981-10-13, 35 y.o.   MRN: [id number]  HPI  Here with c/o 1 mo mild numbing type of discomfort to left anterior thigh without abd pain, LBP or other extremity pain, weakness or numbness.  No HA, facial droop or other lateralizing neuro signs. Pt denies chest pain, increased sob or doe, wheezing, orthopnea, PND, increased LE swelling, palpitations, dizziness or syncope.  Pt denies polydipsia, polyuria,  Has gained wt, wears tight belt on a regular basis.   Past Medical History:  Diagnosis Date  . GERD (gastroesophageal reflux disease)    Past Surgical History:  Procedure Laterality Date  . TONSILLECTOMY  1990    reports that he has been smoking Cigarettes.  He has been smoking about 1.00 pack per day. He does not have any smokeless tobacco history on file. He reports that he does not drink alcohol or use drugs. family history includes COPD in his father; Cancer in his mother; Diabetes in his father. Allergies  Allergen Reactions  . Antihistamines, Diphenhydramine-Type Anaphylaxis   Current Outpatient Prescriptions on File Prior to Visit  Medication Sig Dispense Refill  . Diclofenac Sodium (PENNSAID) 2 % SOLN Place 2 application onto the skin 2 (two) times daily. 112 g 3  . ibuprofen (ADVIL,MOTRIN) 600 MG tablet Take 1 tablet (600 mg total) by mouth every 8 (eight) hours as needed. 60 tablet 1   No current facility-administered medications on file prior to visit.    Review of Systems  All otherwise neg per pt     Objective:   Physical Exam BP 126/74   Pulse 72   Temp 98 F (36.7 C) (Oral)   Resp 20   Wt 266 lb (120.7 kg)   SpO2 97%   BMI 39.28 kg/m  VS noted, not ill appearing Constitutional: Pt appears in no apparent distress HENT: Head: NCAT.  Right Ear: External ear normal.  Left Ear: External ear normal.  Eyes: . Pupils are equal, round, and reactive to light. Conjunctivae and EOM are normal Neck: Normal range  of motion. Neck supple.  Cardiovascular: Normal rate and regular rhythm.   Pulmonary/Chest: Effort normal and breath sounds without rales or wheezing.  Abd:  Soft, NT, ND, + BS Neurological: Pt is alert. Not confused , motor 5/5 intact, with some mild numbness noted anterior left thigh only Skin: Skin is warm. No rash, no LE edema Psychiatric: Pt behavior is normal. No agitation.     Assessment & Plan:

## 2016-09-30 NOTE — Patient Instructions (Signed)
OK to use the Voltaran gel topical for pain  Please do not wear tight clothes or belts as this can aggrevate the nerve  Please continue all other medications as before, and refills have been done if requested.  Please have the pharmacy call with any other refills you may need.  Please keep your appointments with your specialists as you may have planned

## 2016-10-05 NOTE — Assessment & Plan Note (Signed)
Recent onset, for volt gel topical prn trial, also for wt loss and avoid tight fitting clothes

## 2016-11-25 NOTE — Progress Notes (Signed)
[name] [address] [place], [place] [address] Phone: [phone] Subjective:    I'm seeing this patient by the request  of:  Oliver Barre, MD   CC: left ankle pain follow-up   BJY:NWGNFAOZHY  Douglas Maxwell is a 36 y.o. male coming in with complaint of left ankle pain.  Patient was last seen 4 months ago. The left side did have more of a posterior tibialis tendinitis and did respond very well to the injection. Patient states understanding of the right side. Patient states that one week ago started having popping. Patient states that the knee and some swelling as well as unfortunately some bruising. That has improved but importantly the pain has not. Still over the medial aspect of the ankle.  Past Medical History:  Diagnosis Date  . GERD (gastroesophageal reflux disease)    Past Surgical History:  Procedure Laterality Date  . TONSILLECTOMY  1990   Social History   Social History  . Marital status: Single    Spouse name: N/A  . Number of children: N/A  . Years of education: N/A   Social History Main Topics  . Smoking status: Current Every Day Smoker    Packs/day: 1.00    Types: Cigarettes  . Smokeless tobacco: None  . Alcohol use No  . Drug use: No  . Sexual activity: Not Asked   Other Topics Concern  . None   Social History Narrative  . None   Allergies  Allergen Reactions  . Antihistamines, Diphenhydramine-Type Anaphylaxis   Family History  Problem Relation Age of Onset  . Cancer Mother     lung  . Diabetes Father   . COPD Father     Past medical history, social, surgical and family history all reviewed in electronic medical record.  No pertanent information unless stated regarding to the chief complaint.   Review of Systems: No headache, visual changes, nausea, vomiting, diarrhea, constipation, dizziness, abdominal pain, skin rash, fevers, chills, night sweats, weight loss, swollen lymph nodes, body aches, joint  swelling, muscle aches, chest pain, shortness of breath, mood changes.   Objective  Blood pressure 120/82, pulse 87, height 5\' 9"  (1.753 m), weight 270 lb (122.5 kg), SpO2 97 %.  General: No apparent distress alert and oriented x3 mood and affect normal, dressed appropriately.  HEENT: Pupils equal, extraocular movements intact  Respiratory: Patient's speak in full sentences and does not appear short of breath  Cardiovascular: No lower extremity edema, non tender, no erythema  Skin: Warm dry intact with no signs of infection or rash on extremities or on axial skeleton.  Abdomen: Soft nontender  Neuro: Cranial nerves II through XII are intact, neurovascularly intact in all extremities with 2+ DTRs and 2+ pulses.  Lymph: No lymphadenopathy of posterior or anterior cervical chain or axillae bilaterally.  Gait normal with good balance and coordination.  MSK:  Non tender with full range of motion and good stability and symmetric strength and tone of shoulders, elbows, wrist, hip, knee and bilaterally.  Ankle: Right No visible erythema or swelling. Range of motion is full in all directions. Strength is 5/5 in all directions. Stable lateral and medial ligaments; squeeze test and kleiger test unremarkable; Talar dome nontender; Severe tenderness over the posterior tibialis tendon and mild over the navicular bone. Negative percussion test. Patient with standing does not supinate foot.  Contralateral foot has significant pes planus with overpronation.  Foot exam shows the patient does have severe over pronation of  the hindfoot on the medial longitudinal arch. No splaying of the toes.        Impression and Recommendations:     This case required medical decision making of moderate complexity.      Note: This dictation was prepared with Dragon dictation along with smaller phrase technology. Any transcriptional errors that result from this process are unintentional.

## 2016-11-26 ENCOUNTER — Encounter: Payer: Self-pay | Admitting: Family Medicine

## 2016-11-26 ENCOUNTER — Ambulatory Visit (INDEPENDENT_AMBULATORY_CARE_PROVIDER_SITE_OTHER): Payer: BLUE CROSS/BLUE SHIELD | Admitting: Family Medicine

## 2016-11-26 DIAGNOSIS — S96811A Strain of other specified muscles and tendons at ankle and foot level, right foot, initial encounter: Secondary | ICD-10-CM | POA: Diagnosis not present

## 2016-11-26 DIAGNOSIS — M216X2 Other acquired deformities of left foot: Secondary | ICD-10-CM

## 2016-11-26 DIAGNOSIS — S86111A Strain of other muscle(s) and tendon(s) of posterior muscle group at lower leg level, right leg, initial encounter: Secondary | ICD-10-CM | POA: Insufficient documentation

## 2016-11-26 NOTE — Assessment & Plan Note (Signed)
Will need custom orthotics in the long run.

## 2016-11-26 NOTE — Patient Instructions (Addendum)
Good to see you  Douglas Maxwell is your friend. Try avoid walking without the brace even at home for 2 weeks. You do not need to wear it at night No exercises either for 2 weeks.  In 3 weeks see me again and depending we will either start exercises, injection or consider custom orthotics.

## 2016-11-26 NOTE — Assessment & Plan Note (Signed)
Patient Douglas Maxwell have a partial rupture of the posterior tibialis tendon. We discussed with patient at great length. Patient will be put in a Aircast at this moment. Declined a Cam Walker. Decline formal physical therapy. We discussed icing regimen and home exercises. We discussed objective recently do a which ones to avoid. Patient will follow-up with me again in 3 weeks. At that time if doing better we'll consider starting patient with home exercises or we'll consider custom orthotics as well.

## 2016-12-14 ENCOUNTER — Other Ambulatory Visit: Payer: Self-pay | Admitting: Internal Medicine

## 2016-12-15 NOTE — Telephone Encounter (Signed)
Routing to dr plotnikov, please advise, thanks 

## 2016-12-17 NOTE — Progress Notes (Signed)
[name] [address] [place], [place] [address] Phone: [phone] Subjective:    I'm seeing this patient by the request  of:  Oliver Barre, MD   CC: Right ankle pain f/u   [id number]  Douglas Maxwell is a 36 y.o. male coming in with complaint of right ankle pain. Patient was seen previously and did have a tear of the posterior tibialis tendon. Patient was to do conservative therapy and increase slowly. Topical anti-inflammatories are given. Patient states no significant improvement. Patient did have 2 help his house secondary to water leak was slipping a significant amount. Feels like that may have backed them up. Not doing the exercises regularly. Did wear the brace for approximately 1 week and then discontinued it.  Past Medical History:  Diagnosis Date  . GERD (gastroesophageal reflux disease)    Past Surgical History:  Procedure Laterality Date  . TONSILLECTOMY  1990   Social History   Social History  . Marital status: Single    Spouse name: N/A  . Number of children: N/A  . Years of education: N/A   Social History Main Topics  . Smoking status: Current Every Day Smoker    Packs/day: 1.00    Types: Cigarettes  . Smokeless tobacco: Not on file  . Alcohol use No  . Drug use: No  . Sexual activity: Not on file   Other Topics Concern  . Not on file   Social History Narrative  . No narrative on file   Allergies  Allergen Reactions  . Antihistamines, Diphenhydramine-Type Anaphylaxis   Family History  Problem Relation Age of Onset  . Cancer Mother     lung  . Diabetes Father   . COPD Father     Past medical history, social, surgical and family history all reviewed in electronic medical record.  No pertanent information unless stated regarding to the chief complaint.   Review of Systems: No headache, visual changes, nausea, vomiting, diarrhea, constipation, dizziness, abdominal pain, skin rash, fevers, chills, night sweats,  weight loss, swollen lymph nodes, body aches, joint swelling, muscle aches, chest pain, shortness of breath, mood changes.     Objective  There were no vitals taken for this visit.  Systems examined below as of 12/18/16 General: NAD A&O x3 mood, affect normal  HEENT: Pupils equal, extraocular movements intact no nystagmus Respiratory: not short of breath at rest or with speaking Cardiovascular: No lower extremity edema, non tender Skin: Warm dry intact with no signs of infection or rash on extremities or on axial skeleton. Abdomen: Soft nontender, no masses Neuro: Cranial nerves  intact, neurovascularly intact in all extremities with 2+ DTRs and 2+ pulses. Lymph: No lymphadenopathy appreciated today  Gait mild antalgic gait MSK: Non tender with full range of motion and good stability and symmetric strength and tone of shoulders, elbows, wrist,  knee hips bilaterally.  Ankle: Right Trace swelling over the medial malleolus. Range of motion is full in all directions. Strength is 5/5 in all directions. Stable lateral and medial ligaments; squeeze test and kleiger test unremarkable; Talar dome nontender; Moderate tenderness over the posterior tibialis tendon. No tenderness over the navicular bone today.. Patient with standing does not supinate foot still Contralateral foot has significant pes planus with overpronation.  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 No splaying of the toes. This is bilateral. Mild hallux limitus of the right foot.  Procedure: Real-time  Ultrasound Guided Injection of right posterior tibialis tendon sheath Device: GE Logiq Q7 Ultrasound guided injection is preferred based studies that show increased duration, increased effect, greater accuracy, decreased procedural pain, increased response rate, and decreased cost with ultrasound guided versus blind injection.  Verbal informed consent obtained.    Time-out conducted.  Noted no overlying erythema, induration, or other signs of local infection.  Skin prepped in a sterile fashion.  Local anesthesia: Topical Ethyl chloride.  With sterile technique and under real time ultrasound guidance:  With a 25-gauge half-inch needle patient was injected with a total of 0.5 mL of 0.5% Marcaine and 0.5 mL of Kenalog 40 mg/dL. Completed without difficulty  Pain immediately resolved suggesting accurate placement of the medication.  Advised to call if fevers/chills, erythema, induration, drainage, or persistent bleeding.  Images permanently stored and available for review in the ultrasound unit.  Impression: Technically successful ultrasound guided injection.       Impression and Recommendations:     This case required medical decision making of moderate complexity.      Note: This dictation was prepared with Dragon dictation along with smaller phrase technology. Any transcriptional errors that result from this process are unintentional.

## 2016-12-18 ENCOUNTER — Ambulatory Visit: Payer: Self-pay

## 2016-12-18 ENCOUNTER — Encounter: Payer: Self-pay | Admitting: Family Medicine

## 2016-12-18 ENCOUNTER — Ambulatory Visit (INDEPENDENT_AMBULATORY_CARE_PROVIDER_SITE_OTHER): Payer: BLUE CROSS/BLUE SHIELD | Admitting: Family Medicine

## 2016-12-18 VITALS — BP 138/86 | HR 73 | Wt 271.0 lb

## 2016-12-18 DIAGNOSIS — M76822 Posterior tibial tendinitis, left leg: Secondary | ICD-10-CM

## 2016-12-18 DIAGNOSIS — S86111A Strain of other muscle(s) and tendon(s) of posterior muscle group at lower leg level, right leg, initial encounter: Secondary | ICD-10-CM

## 2016-12-18 DIAGNOSIS — M216X2 Other acquired deformities of left foot: Secondary | ICD-10-CM | POA: Diagnosis not present

## 2016-12-18 DIAGNOSIS — R269 Unspecified abnormalities of gait and mobility: Secondary | ICD-10-CM

## 2016-12-18 DIAGNOSIS — S96811A Strain of other specified muscles and tendons at ankle and foot level, right foot, initial encounter: Secondary | ICD-10-CM

## 2016-12-18 NOTE — Assessment & Plan Note (Signed)
No improvement compared to worsening symptoms. Injection given today with patient having some mild increase in strength. Likely the tear was healing. We'll decrease inflammation to see if this will be beneficial. Second to patient's pronation of the foot I'm concerned the patient will continue to have recurrent symptoms. He is a candidate for custom orthotics and will be fitted in the near future. We discussed continuing the home exercises. Decline formal physical therapy. Follow-up again in 3-4 weeks.

## 2016-12-18 NOTE — Assessment & Plan Note (Signed)
Will be fitted in custom orthotics.

## 2016-12-18 NOTE — Patient Instructions (Addendum)
Good to see you  We injected you today  Keep up with the exercises  We will call you when we get your size in for the orthotics and get them molded.  See me again in 3-4 weeks.

## 2016-12-23 ENCOUNTER — Ambulatory Visit (INDEPENDENT_AMBULATORY_CARE_PROVIDER_SITE_OTHER): Payer: BLUE CROSS/BLUE SHIELD | Admitting: Family Medicine

## 2016-12-23 DIAGNOSIS — M76822 Posterior tibial tendinitis, left leg: Secondary | ICD-10-CM

## 2016-12-23 DIAGNOSIS — S96811A Strain of other specified muscles and tendons at ankle and foot level, right foot, initial encounter: Secondary | ICD-10-CM | POA: Diagnosis not present

## 2016-12-23 DIAGNOSIS — S86111A Strain of other muscle(s) and tendon(s) of posterior muscle group at lower leg level, right leg, initial encounter: Secondary | ICD-10-CM

## 2016-12-23 DIAGNOSIS — M216X2 Other acquired deformities of left foot: Secondary | ICD-10-CM

## 2016-12-23 NOTE — Assessment & Plan Note (Signed)
Patient placed in orthotics today. Increase wear slowly.  Discussed proper care.  Given technique and management today as well with ATC in addition to time spent on the orthotic.

## 2016-12-23 NOTE — Progress Notes (Signed)
Procedure Note   Patient was fitted for a : standard, cushioned, semi-rigid orthotic. The orthotic was heated and afterward the patient patient seated position and molded The patient was positioned in subtalar neutral position and 10 degrees of ankle dorsiflexion in a weight bearing stance. After completion of molding, patient did have orthotic management The blank was ground to a stable position for weight bearing. Size: Men's 12 Base: Control, Carbon fiber Additional Posting and Padding: 2-Bilateral medial postings: 300/140 The patient ambulated these, and they were very comfortable.

## 2017-01-02 ENCOUNTER — Other Ambulatory Visit: Payer: Self-pay | Admitting: Internal Medicine

## 2017-01-19 ENCOUNTER — Other Ambulatory Visit: Payer: Self-pay | Admitting: Internal Medicine

## 2017-01-31 ENCOUNTER — Other Ambulatory Visit: Payer: Self-pay | Admitting: Internal Medicine

## 2017-02-02 NOTE — Telephone Encounter (Signed)
Med was filled on 01/19/17. Please advise.

## 2017-02-14 ENCOUNTER — Other Ambulatory Visit: Payer: Self-pay | Admitting: Internal Medicine

## 2017-02-17 ENCOUNTER — Encounter (HOSPITAL_COMMUNITY): Payer: Self-pay | Admitting: Emergency Medicine

## 2017-02-17 ENCOUNTER — Emergency Department (HOSPITAL_COMMUNITY)
Admission: EM | Admit: 2017-02-17 | Discharge: 2017-02-17 | Disposition: A | Discharge status: Home / Self Care | Payer: BLUE CROSS/BLUE SHIELD | Attending: Emergency Medicine | Admitting: Emergency Medicine

## 2017-02-17 DIAGNOSIS — S46912A Strain of unspecified muscle, fascia and tendon at shoulder and upper arm level, left arm, initial encounter: Secondary | ICD-10-CM

## 2017-02-17 DIAGNOSIS — X58XXXA Exposure to other specified factors, initial encounter: Secondary | ICD-10-CM | POA: Diagnosis not present

## 2017-02-17 DIAGNOSIS — S46012A Strain of muscle(s) and tendon(s) of the rotator cuff of left shoulder, initial encounter: Secondary | ICD-10-CM | POA: Diagnosis not present

## 2017-02-17 DIAGNOSIS — Y929 Unspecified place or not applicable: Secondary | ICD-10-CM | POA: Insufficient documentation

## 2017-02-17 DIAGNOSIS — Y999 Unspecified external cause status: Secondary | ICD-10-CM | POA: Insufficient documentation

## 2017-02-17 DIAGNOSIS — Y939 Activity, unspecified: Secondary | ICD-10-CM | POA: Insufficient documentation

## 2017-02-17 DIAGNOSIS — F17210 Nicotine dependence, cigarettes, uncomplicated: Secondary | ICD-10-CM | POA: Diagnosis not present

## 2017-02-17 DIAGNOSIS — S4992XA Unspecified injury of left shoulder and upper arm, initial encounter: Secondary | ICD-10-CM | POA: Diagnosis present

## 2017-02-17 MED ORDER — CYCLOBENZAPRINE HCL 10 MG PO TABS: 10.0000 mg | ORAL_TABLET | Freq: Two times a day (BID) | ORAL | 0 refills | Status: DC | PRN

## 2017-02-17 MED ORDER — IBUPROFEN 800 MG PO TABS: 800.0000 mg | ORAL_TABLET | Freq: Three times a day (TID) | ORAL | 0 refills | Status: DC

## 2017-02-17 NOTE — ED Notes (Signed)
Pt A&OX4, ambulatory at d/c with steady gait, NAD and states he has all of his belongings with him at d/c 

## 2017-02-17 NOTE — ED Triage Notes (Signed)
Patient with left shoulder pain that started yesterday.  He denies any injury at this time to the shoulder.  He states that there is some numbness that he feels in the arm, it is hard to lift the arm.

## 2017-02-17 NOTE — ED Notes (Signed)
Pt reports having issues with nerve pain/ numbness in his arm a year ago. He states this feels like its in the muscle of his shoulder, neck, back.

## 2017-02-17 NOTE — ED Provider Notes (Signed)
[facility]EMERGENCY DEPT Provider Note   CSN: [id number] Arrival date & time: 02/17/17  2100  By signing my name below, I, Cynda Acres, attest that this documentation has been prepared under the direction and in the presence of Roxy Horseman, PA-C. Electronically Signed: Cynda Acres, Scribe. 02/17/17. 11:15 PM.  History   Chief Complaint Chief Complaint  Patient presents with  . Shoulder Pain   HPI Comments: Douglas Maxwell is a 36 y.o. male with no pertinent medical history, who presents to the Emergency Department complaining of sudden-onset, constant left shoulder pain that began yesterday. Patient states he is unsure how his shoulder was injured. Patient reports associated radiation to the back/neck, neck stiffness, and tingling down the left arm. No modifying factors indicated. Patient denies IV drugs, recreational drugs, or cancer history. Patient denies any numbness, weakness, nausea, or vomiting.  The history is provided by the patient. No language interpreter was used.    Past Medical History:  Diagnosis Date  . GERD (gastroesophageal reflux disease)     Patient Active Problem List   Diagnosis Date Noted  . Abnormality of gait 12/18/2016  . Rupture of posterior tibialis tendon, right, initial encounter 11/26/2016  . Meralgia paresthetica, left 09/30/2016  . Left tibialis posterior tendinitis 05/15/2016  . 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 Left ankle pain 04/16/2016  . Preventative health care 07/19/2015  . Left cervical radiculopathy 07/19/2015  . GERD (gastroesophageal reflux disease)     Past Surgical History:  Procedure Laterality Date  . TONSILLECTOMY  1990       Home Medications    Prior to Admission medications   Medication Sig Start Date End Date Taking? Authorizing Provider  Diclofenac Sodium (PENNSAID) 2 % SOLN Place 2 application onto the skin 2 (two) times daily. 05/15/16   [name], DO  ibuprofen (ADVIL,MOTRIN) 600 MG  tablet TAKE 1 TABLET EVERY 6 HOURS AS NEEDED 02/02/17   [name], MD    Family History Family History  Problem Relation Age of Onset  . Cancer Mother     lung  . Diabetes Father   . COPD Father     Social History Social History  Substance Use Topics  . Smoking status: Current Every Day Smoker    Packs/day: 1.00    Types: Cigarettes  . Smokeless tobacco: Never Used  . Alcohol use No     Allergies   Antihistamines, diphenhydramine-type   Review of Systems Review of Systems  Constitutional: Negative for fever.  Gastrointestinal: Negative for nausea and vomiting.  Musculoskeletal: Positive for arthralgias (right shoulder), back pain (right upper), neck pain (right-sided) and neck stiffness.  Neurological: Negative for weakness and numbness.     Physical Exam Updated Vital Signs BP 126/78 (BP Location: Right Arm)   Pulse 91   Temp 97.8 F (36.6 C) (Oral)   Resp (!) 22   Ht  (1.6 m)   Wt 265 lb (120.2 kg)   SpO2 100%   BMI 46.94 kg/m   Physical Exam  Physical Exam  Constitutional: Pt appears well-developed and well-nourished. No distress.  HENT:  Head: Normocephalic and atraumatic.  Mouth/Throat: Oropharynx is clear and moist. No oropharyngeal exudate.  Eyes: Conjunctivae are normal.  Neck: Normal range of motion. Neck supple.  No meningismus Cardiovascular: Normal rate, regular rhythm and intact distal pulses.   Pulmonary/Chest: Effort normal and breath sounds normal. No respiratory distress. Pt has no wheezes.  Abdominal: Pt exhibits no distension Musculoskeletal:  Left upper  trapezius tender to palpation, no bony CTLS spine tenderness, deformity, step-off, or crepitus Lymphadenopathy: Pt has no cervical adenopathy.  Neurological: Pt is alert and oriented Speech is clear and goal oriented, follows commands Normal 5/5 strength in upper and lower extremities bilaterally including dorsiflexion and plantar flexion, strong and equal grip  strength Sensation intact Great toe extension intact Moves extremities without ataxia, coordination intact Normal gait Normal balance No Clonus Skin: Skin is warm and dry. No rash noted. Pt is not diaphoretic. No erythema.  Psychiatric: Pt has a normal mood and affect. Behavior is normal.  Nursing note and vitals reviewed.  ED Treatments / Results  DIAGNOSTIC STUDIES: Oxygen Saturation is 100% on RA, normal by my interpretation.    COORDINATION OF CARE: 11:14 PM Discussed treatment plan with pt at bedside and pt agreed to plan, which includes anti-inflammatory pain medication and a sling.    Labs (all labs ordered are listed, but only abnormal results are displayed) Labs Reviewed - No data to display  EKG  EKG Interpretation None       Radiology No results found.  Procedures Procedures (including critical care time)  Medications Ordered in ED Medications - No data to display   Initial Impression / Assessment and Plan / ED Course  I have reviewed the triage vital signs and the nursing notes.  Pertinent labs & imaging results that were available during my care of the patient were reviewed by me and considered in my medical decision making (see chart for details).     Patient with back pain/upper left trapezius pain.  No neurological deficits and normal neuro exam.  Patient is ambulatory.  No loss of bowel or bladder control.  Doubt cauda equina.  Denies fever,  doubt epidural abscess or other lesion. Recommend back exercises, stretching, RICE, and will treat with a short course of ibuprofen and flexeril.  Encouraged the patient that there could be a need for additional workup and/or imaging such as MRI, if the symptoms do not resolve. Patient advised that if the back pain does not resolve, or radiates, this could progress to more serious conditions and is encouraged to follow-up with PCP or orthopedics within 2 weeks.     Final Clinical Impressions(s) / ED Diagnoses    Final diagnoses:  Strain of left shoulder, initial encounter    New Prescriptions Discharge Medication List as of 02/17/2017 11:29 PM    START taking these medications   Details  cyclobenzaprine (FLEXERIL) 10 MG tablet Take 1 tablet (10 mg total) by mouth 2 (two) times daily as needed for muscle spasms., Starting Tue 02/17/2017, Print       I personally performed the services described in this documentation, which was scribed in my presence. The recorded information has been reviewed and is accurate.       Roxy Horseman, PA-C 02/18/17 0030    Shaune Pollack, MD 02/18/17 [phone]

## 2017-02-19 NOTE — Progress Notes (Signed)
[name] [address] [place], [place] [address] Phone: [phone] Subjective:    I'm seeing this patient by the request  of:    CC: Follow-up left ankle, shoulder pain.  [id number]  Douglas Maxwell is a 36 y.o. male coming in with complaint of left ankle. Patient was found and posterior tibialis tendinitis. Patient has been much better since he has been in custom orthotics. Patient continues to do relatively well. Did get new shoes but had to make some adjustments.  Patient is having more of a left shoulder and neck pain. Patient was seen in the emergency department for a sudden onset of left shoulder pain that became 6 days ago. Patient does not remember any true injury. Patient states that it seems to radiate to the neck down the scapular region as well as the back. Patient states that this tingling going down the left arm. Patient was given a prescription for muscle relaxer. Patient rates the severity of pain a 6 out of 10.      Past Medical History:  Diagnosis Date  . GERD (gastroesophageal reflux disease)    Past Surgical History:  Procedure Laterality Date  . TONSILLECTOMY  1990   Social History   Social History  . Marital status: Single    Spouse name: N/A  . Number of children: N/A  . Years of education: N/A   Social History Main Topics  . Smoking status: Current Every Day Smoker    Packs/day: 1.00    Types: Cigarettes  . Smokeless tobacco: Never Used  . Alcohol use No  . Drug use: No  . Sexual activity: Not on file   Other Topics Concern  . Not on file   Social History Narrative  . No narrative on file   Allergies  Allergen Reactions  . Antihistamines, Diphenhydramine-Type Anaphylaxis   Family History  Problem Relation Age of Onset  . Cancer Mother     lung  . Diabetes Father   . COPD Father     Past medical history, social, surgical and family history all reviewed in electronic medical record.  No  pertanent information unless stated regarding to the chief complaint.   Review of Systems: No headache, visual changes, nausea, vomiting, diarrhea, constipation, dizziness, abdominal pain, skin rash, fevers, chills, night sweats, weight loss, swollen lymph nodes, body aches, joint swelling, muscle aches, chest pain, shortness of breath, mood changes.    Objective  Blood pressure 138/80, pulse 78, height  (1.753 m), weight 268 lb (121.6 kg), SpO2 98 %.   Systems examined below as of 02/20/17 General: NAD A&O x3 mood, affect normal  HEENT: Pupils equal, extraocular movements intact no nystagmus Respiratory: not short of breath at rest or with speaking Cardiovascular: No lower extremity edema, non tender Skin: Warm dry intact with no signs of infection or rash on extremities or on axial skeleton. Abdomen: Soft nontender, no masses Neuro: Cranial nerves  intact, neurovascularly intact in all extremities with 2+ DTRs and 2+ pulses. Lymph: No lymphadenopathy appreciated today  Gait normal with good balance and coordination.  MSK: Non tender with full range of motion and good stability and symmetric strength and tone of shoulders, elbows, wrist,  knee hips bilaterally.    Ankle: Bilateral No visible erythema or swelling. Range of motion is full in all directions. Strength 4 out of 5 with inversion  Stable lateral and medial ligaments; squeeze test and kleiger test unremarkable; Talar dome nontender; No  pain at base of 5th MT; No tenderness over cuboid; No tenderness over N spot or navicular prominence Posterior tibialis insufficiency Able to walk 4 steps.  Neck: Inspection unremarkable. No palpable stepoffs. Negative Spurling's maneuver. lackslast 10 of rotation and side bending Grip strength and sensation normal in bilateral hands Strength good C4 to T1 distribution No sensory change to C4 to T1 Negative Hoffman sign bilaterally Reflexes normal  Procedure note.  [id number]; 15  minutes spent for Therapeutic exercises as stated in above notes.  This included exercises focusing on stretching, strengthening, with significant focus on eccentric aspects.     Basic scapular stabilization to include adduction and depression of scapula Scaption, focusing on proper movement and good control Rows with theraband  Proper technique shown and discussed handout in great detail with ATC.  All questions were discussed and answered.     Impression and Recommendations:     This case required medical decision making of moderate complexity.      Note: This dictation was prepared with Dragon dictation along with smaller phrase technology. Any transcriptional errors that result from this process are unintentional.

## 2017-02-20 ENCOUNTER — Ambulatory Visit (INDEPENDENT_AMBULATORY_CARE_PROVIDER_SITE_OTHER): Payer: BLUE CROSS/BLUE SHIELD | Admitting: Family Medicine

## 2017-02-20 ENCOUNTER — Encounter: Payer: Self-pay | Admitting: Family Medicine

## 2017-02-20 DIAGNOSIS — S96811A Strain of other specified muscles and tendons at ankle and foot level, right foot, initial encounter: Secondary | ICD-10-CM | POA: Diagnosis not present

## 2017-02-20 DIAGNOSIS — M62838 Other muscle spasm: Secondary | ICD-10-CM | POA: Insufficient documentation

## 2017-02-20 DIAGNOSIS — M5412 Radiculopathy, cervical region: Secondary | ICD-10-CM

## 2017-02-20 DIAGNOSIS — M216X2 Other acquired deformities of left foot: Secondary | ICD-10-CM

## 2017-02-20 DIAGNOSIS — S86111A Strain of other muscle(s) and tendon(s) of posterior muscle group at lower leg level, right leg, initial encounter: Secondary | ICD-10-CM

## 2017-02-20 MED ORDER — PREDNISONE 50 MG PO TABS: 50.0000 mg | ORAL_TABLET | Freq: Every day | ORAL | 0 refills | Status: DC

## 2017-02-20 MED ORDER — VITAMIN D (ERGOCALCIFEROL) 1.25 MG (50000 UNIT) PO CAPS: 50000.0000 [IU] | ORAL_CAPSULE | ORAL | 0 refills | Status: DC

## 2017-02-20 NOTE — Progress Notes (Signed)
Pre visit review using our clinic review tool, if applicable. No additional management support is needed unless otherwise documented below in the visit note. 

## 2017-02-20 NOTE — Assessment & Plan Note (Signed)
Has had radicular symptoms but negative Spurling's today. We'll continue to monitor and if worsening symptoms consider x-rays.

## 2017-02-20 NOTE — Patient Instructions (Addendum)
Good to see you  Ice 20 minutes 2 times daily. Usually after activity and before bed. Exercises 3 times a week.  If you get worsening pain again try prednisone daily for 5 days.  Once weekly vitamin D for 12 weeks Muscle relaxer as needed.  See me again in 2-4 weeks if not better otherwise see me when needed.

## 2017-02-20 NOTE — Assessment & Plan Note (Signed)
Patient did have more of a spasm of the neck. Seems to be resolving at this time. Given vitamin D for muscle strength and endurance, discussed different medications to help him with any type of breakthrough pain. We discussed ergonomics and proper lifting mechanics. Patient given a note to return to work full duty. Follow-up again as needed

## 2017-02-20 NOTE — Assessment & Plan Note (Signed)
We'll need to keep monitoring closely.

## 2017-02-20 NOTE — Assessment & Plan Note (Signed)
Stable. Continue over-the-counter as well as custom orthotics daily.

## 2017-03-03 ENCOUNTER — Other Ambulatory Visit: Payer: Self-pay | Admitting: Internal Medicine

## 2017-03-16 ENCOUNTER — Other Ambulatory Visit: Payer: Self-pay | Admitting: Internal Medicine

## 2017-03-16 NOTE — Telephone Encounter (Signed)
Routing to dr john, please advise, thanks 

## 2017-03-31 ENCOUNTER — Other Ambulatory Visit: Payer: Self-pay | Admitting: Internal Medicine

## 2017-04-13 ENCOUNTER — Other Ambulatory Visit: Payer: Self-pay | Admitting: Internal Medicine

## 2017-05-03 ENCOUNTER — Other Ambulatory Visit: Payer: Self-pay | Admitting: Internal Medicine

## 2017-05-12 ENCOUNTER — Other Ambulatory Visit: Payer: Self-pay | Admitting: Family Medicine

## 2017-05-18 ENCOUNTER — Other Ambulatory Visit: Payer: Self-pay | Admitting: Internal Medicine

## 2017-06-06 ENCOUNTER — Other Ambulatory Visit: Payer: Self-pay | Admitting: Internal Medicine

## 2017-07-04 ENCOUNTER — Other Ambulatory Visit: Payer: Self-pay | Admitting: Internal Medicine

## 2017-08-03 ENCOUNTER — Other Ambulatory Visit: Payer: Self-pay | Admitting: Internal Medicine

## 2017-08-14 ENCOUNTER — Other Ambulatory Visit: Payer: Self-pay | Admitting: Internal Medicine

## 2017-08-24 NOTE — Progress Notes (Signed)
[name] [address] [place], [place] [address] Phone: [phone] Subjective:     CC: Bilateral ankle pain  BJY:[id number]  Douglas Maxwell is a 36 y.o. male coming in with complaint of bilateral ankle pain. 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 Had responded fairly well to injections previously. Patient states Unfortunate having worsening pain. Been wearing the Orthotics regularly but feels like they've broken down. Has gone through 2 different peers in her shoes.     Past Medical History:  Diagnosis Date  . GERD (gastroesophageal reflux disease)    Past Surgical History:  Procedure Laterality Date  . TONSILLECTOMY  1990   Social History   Social History  . Marital status: Single    Spouse name: N/A  . Number of children: N/A  . Years of education: N/A   Social History Main Topics  . Smoking status: Current Every Day Smoker    Packs/day: 1.00    Types: Cigarettes  . Smokeless tobacco: Never Used  . Alcohol use No  . Drug use: No  . Sexual activity: Not Asked   Other Topics Concern  . None   Social History Narrative  . None   Allergies  Allergen Reactions  . Antihistamines, Diphenhydramine-Type Anaphylaxis   Family History  Problem Relation Age of Onset  . Cancer Mother        lung  . Diabetes Father   . COPD Father      Past medical history, social, surgical and family history all reviewed in electronic medical record.  No pertanent information unless stated regarding to the chief complaint.   Review of Systems:Review of systems updated and as accurate as of 08/25/17  No headache, visual changes, nausea, vomiting, diarrhea, constipation, dizziness, abdominal pain, skin rash, fevers, chills, night sweats, weight loss, swollen lymph nodes, body aches, joint swelling,chest pain, shortness of breath, mood changes. Positive muscle  aches  Objective  Blood pressure 130/80, pulse 87, height  (1.753 m), weight 274 lb (124.3 kg), SpO2 95 %. Systems examined below as of 08/25/17   General: No apparent distress alert and oriented x3 mood and affect normal, dressed appropriately.  HEENT: Pupils equal, extraocular movements intact  Respiratory: Patient's speak in full sentences and does not appear short of breath  Cardiovascular: No lower extremity edema, non tender, no erythema  Skin: Warm dry intact with no signs of infection or rash on extremities or on axial skeleton.  Abdomen: Soft nontender  Neuro: Cranial nerves II through XII are intact, neurovascularly intact in all extremities with 2+ DTRs and 2+ pulses.  Lymph: No lymphadenopathy of posterior or anterior cervical chain or axillae bilaterally.  Gait normal with good balance and coordination.  MSK:  Non tender with full range of motion and good stability and symmetric strength and tone of shoulders, elbows, wrist, hip, knee bilaterally.  Ankle exam bilaterally shows patient does have significant insufficiency overpronation and pes planus.  Procedure: Real-time Ultrasound Guided Injection of left posterior tibialis tendon sheath Device: GE Logiq Q7 Ultrasound guided injection is preferred based studies that show increased duration, increased effect, greater accuracy, decreased procedural pain, increased response rate, and decreased cost with ultrasound guided versus blind injection.  Verbal informed consent obtained.  Time-out conducted.  Noted no overlying erythema, induration, or other signs of local infection.  Skin prepped in a sterile fashion.  Local anesthesia: Topical Ethyl chloride.  With sterile  technique and under real time ultrasound guidance:  With a 25-gauge half-inch needle injected with 0.5 mL of 0.5% Marcaine and 0.5 mL of Kenalog 40 mg/dL. Completed without difficulty  Pain immediately resolved suggesting accurate placement of the medication.   Advised to call if fevers/chills, erythema, induration, drainage, or persistent bleeding.  Unable to save pictures Impression: Technically successful ultrasound guided injection.  Procedure: Real-time Ultrasound Guided Injection of right posterior tibialis tendon sheath Device: GE Logiq Q7 Ultrasound guided injection is preferred based studies that show increased duration, increased effect, greater accuracy, decreased procedural pain, increased response rate, and decreased cost with ultrasound guided versus blind injection.  Verbal informed consent obtained.  Time-out conducted.  Noted no overlying erythema, induration, or other signs of local infection.  Skin prepped in a sterile fashion.  Local anesthesia: Topical Ethyl chloride.  With sterile technique and under real time ultrasound guidance:  25 cage half-inch needle used with 0.5 mL of 0.5% Marcaine and 0.5 mL of Kenalog 40 mg/dL Completed without difficulty  Pain immediately resolved suggesting accurate placement of the medication.  Advised to call if fevers/chills, erythema, induration, drainage, or persistent bleeding.  Unable to save pictures  successful ultrasound guided injection.   Impression and Recommendations:     This case required medical decision making of moderate complexity.      Note: This dictation was prepared with Dragon dictation along with smaller phrase technology. Any transcriptional errors that result from this process are unintentional.

## 2017-08-25 ENCOUNTER — Other Ambulatory Visit: Payer: Self-pay | Admitting: *Deleted

## 2017-08-25 ENCOUNTER — Encounter: Payer: Self-pay | Admitting: Family Medicine

## 2017-08-25 ENCOUNTER — Ambulatory Visit (INDEPENDENT_AMBULATORY_CARE_PROVIDER_SITE_OTHER): Payer: BLUE CROSS/BLUE SHIELD | Admitting: Family Medicine

## 2017-08-25 DIAGNOSIS — M76822 Posterior tibial tendinitis, left leg: Secondary | ICD-10-CM | POA: Diagnosis not present

## 2017-08-25 DIAGNOSIS — M76821 Posterior tibial tendinitis, right leg: Secondary | ICD-10-CM | POA: Diagnosis not present

## 2017-08-25 NOTE — Patient Instructions (Signed)
injected the ankles again today  We need to get you in the blue orthotics at Dr. Berline Chough office.  They will call you and schedule it.  Ice is your friend.  Start the exercises again 3 times a week.  pennsaid pinkie amount topically 2 times daily as needed.  ' The injections should help for a while See me again in 5-6 weeks.

## 2017-08-25 NOTE — Assessment & Plan Note (Signed)
Patient given bilateral injections today. Tolerated the procedure well. We discussed icing regimen and home exercises. We discussed which activities to do in which ones to avoid. Patient will be fitted for different orthotics. Patient will start doing home exercises and icing regimen. Patient in the custom orthotics likely make significant improvement. Follow-up again in 4-6 weeks.

## 2017-08-27 IMAGING — CT CT HEAD W/O CM
1 series · 16 of 30 positions shown, 20 images · non-contrast
Comparison: None.

CLINICAL DATA: Posterior head laceration after slip and fall at
work, striking back of head. No loss of consciousness. LEFT blurry
vision.

EXAM:
CT HEAD WITHOUT CONTRAST
TECHNIQUE: Contiguous axial images were obtained from the base of the skull
through the vertex without intravenous contrast.

[Series 2: head wo · axial · 0.43mm/px · z∈[-134,-8]mm · 16 of 32 slices shown, 20 images]
[im 2/32  brain]
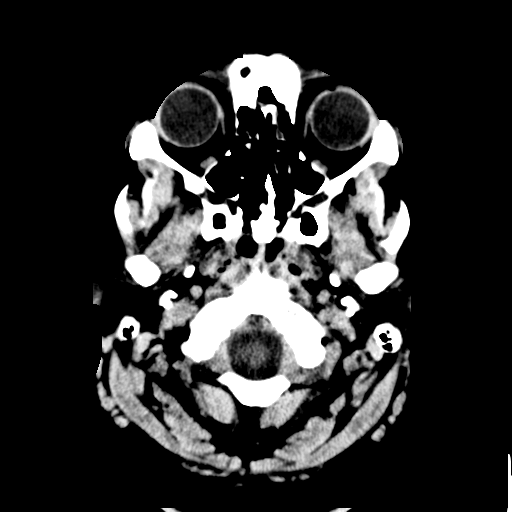
[im 2/32  bone]
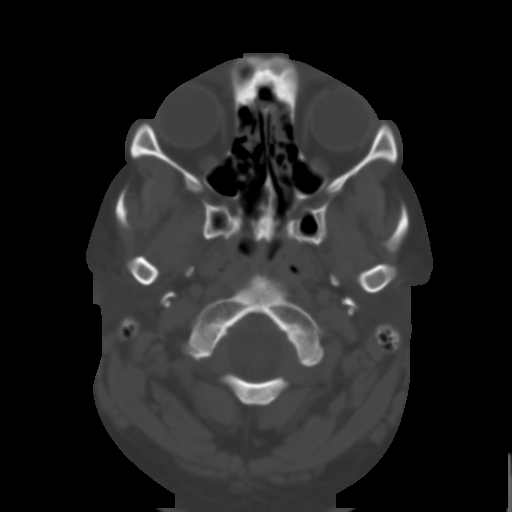
[im 4/32  brain]
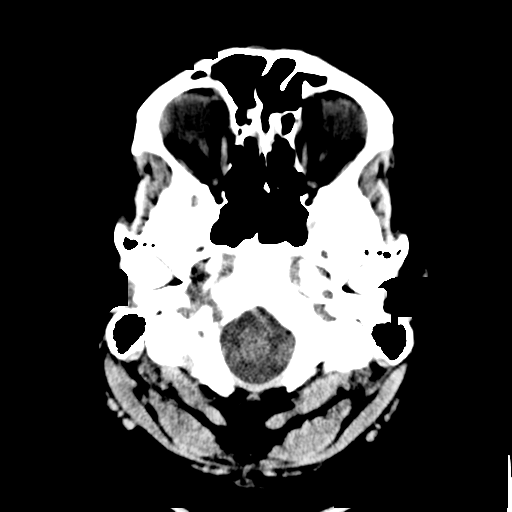
[im 6/32  brain]
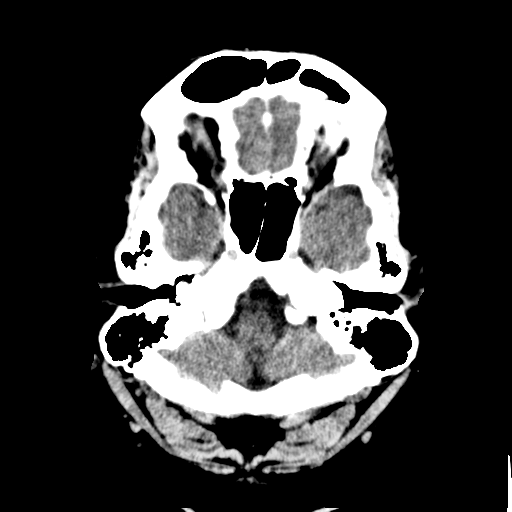
[im 8/32  brain]
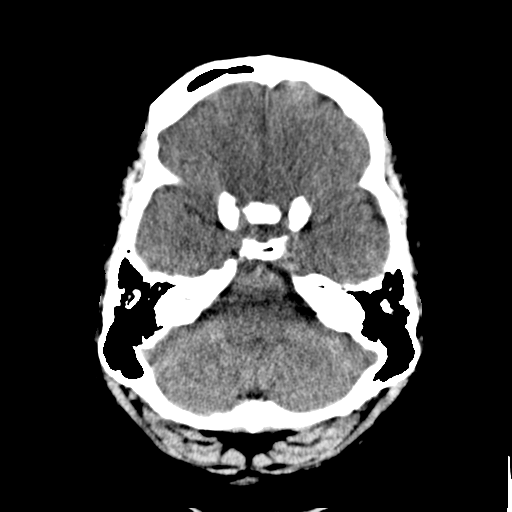
[im 9/32  brain]
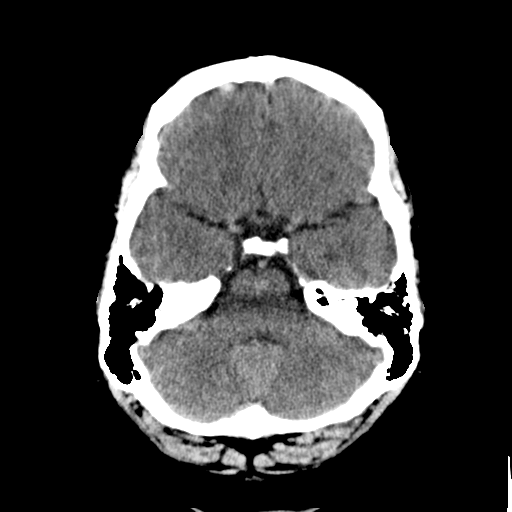
[im 9/32  bone]
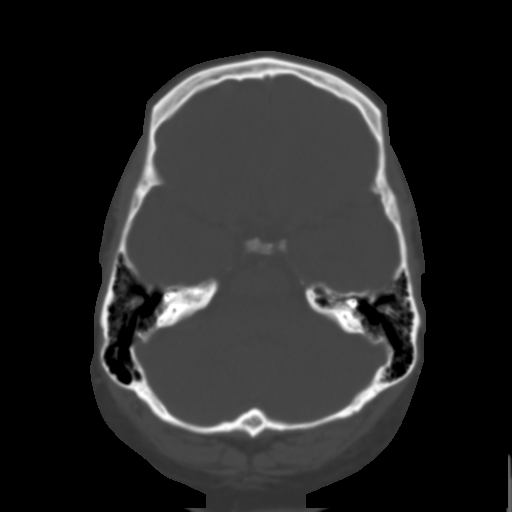
[im 11/32  brain]
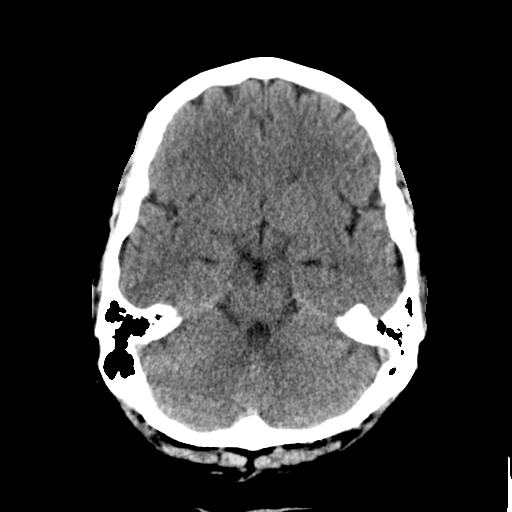
[im 13/32  brain]
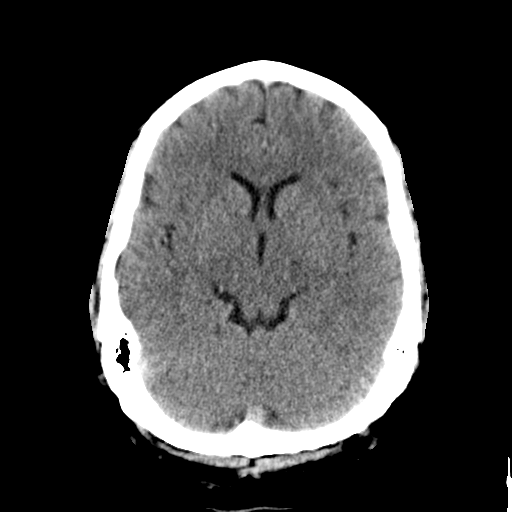
[im 15/32  brain]
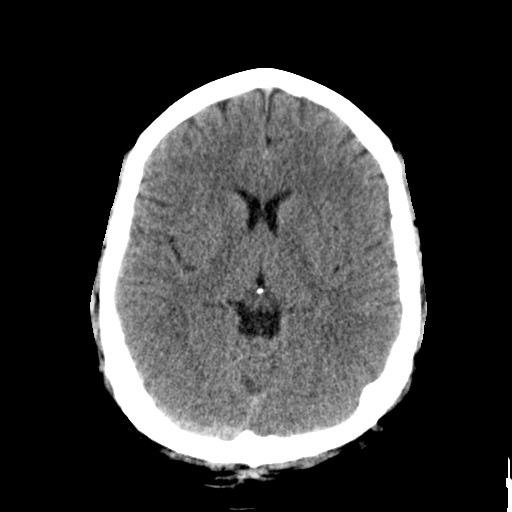
[im 17/32  brain]
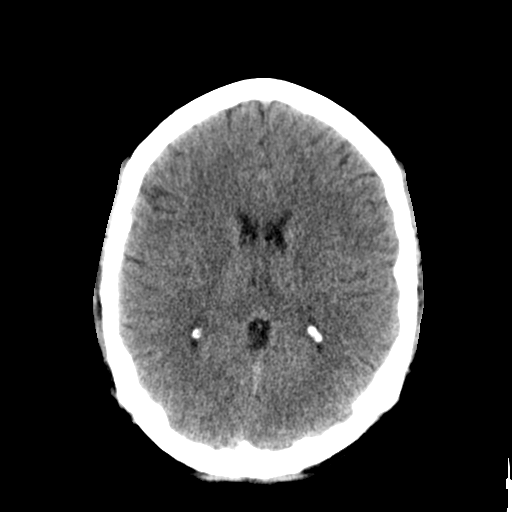
[im 17/32  bone]
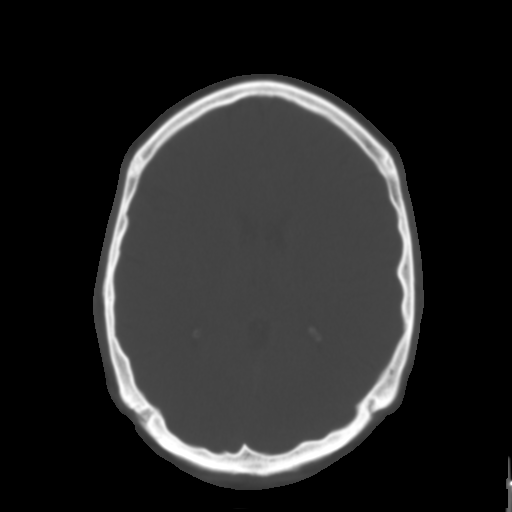
[im 19/32  brain]
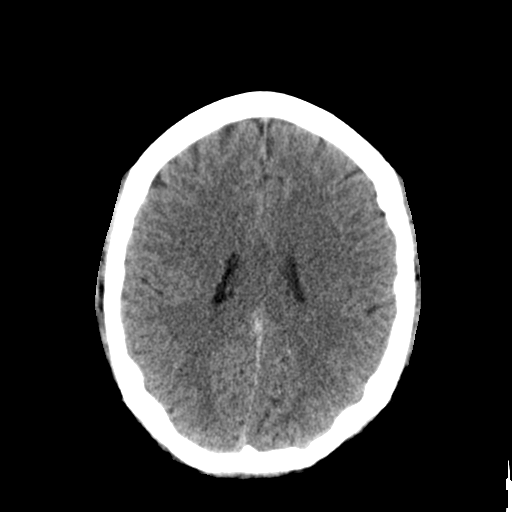
[im 21/32  brain]
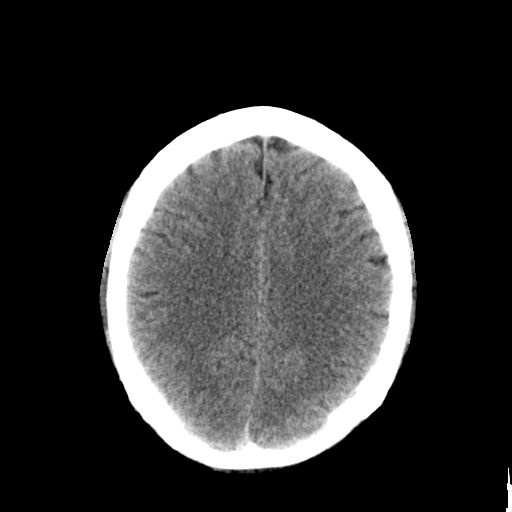
[im 23/32  brain]
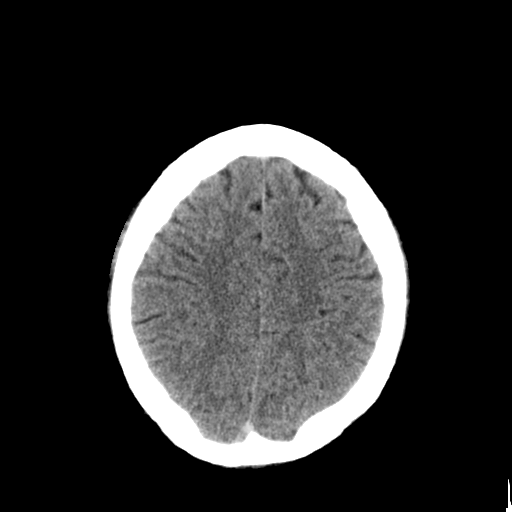
[im 24/32  brain]
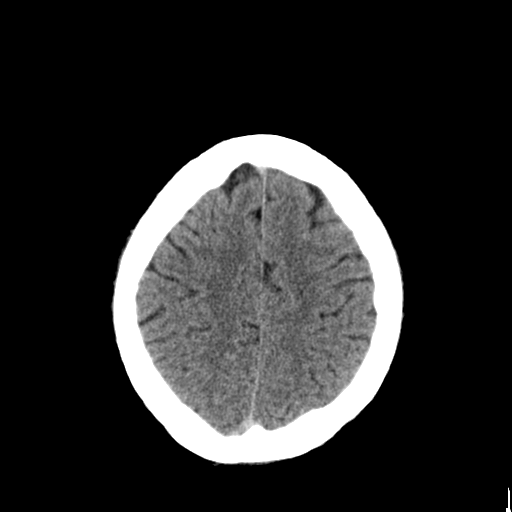
[im 24/32  bone]
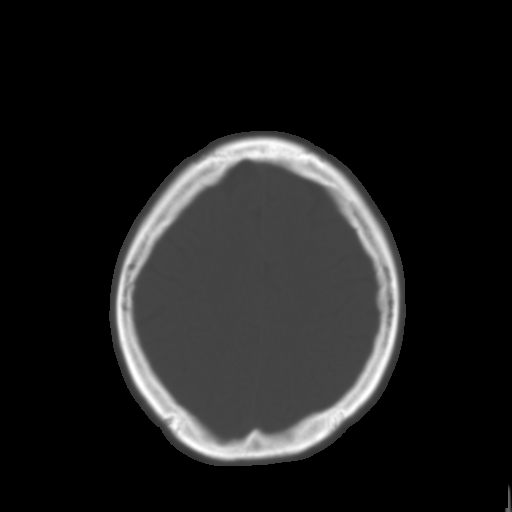
[im 26/32  brain]
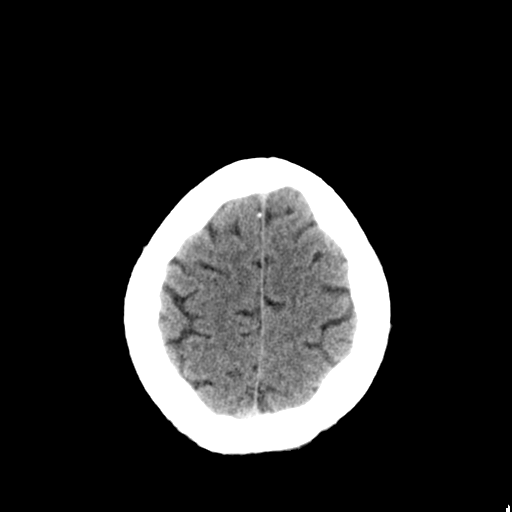
[im 28/32  brain]
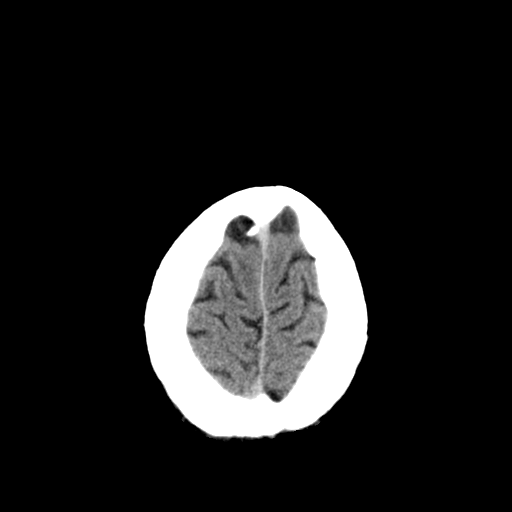
[im 30/32  brain]
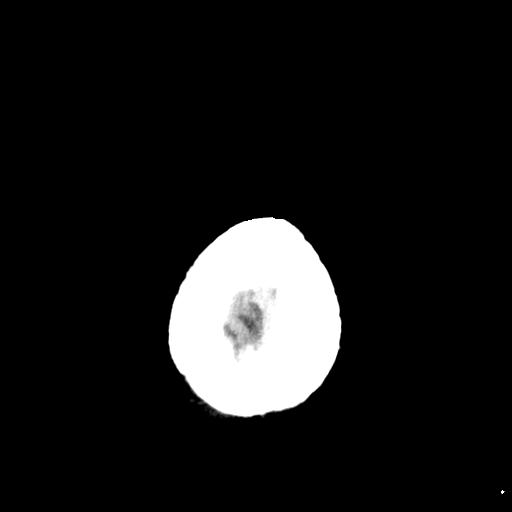

[16 of 30 positions shown; findings below may reference images not displayed]

FINDINGS: The ventricles and sulci are normal. No intraparenchymal hemorrhage,
mass effect nor midline shift. No acute large vascular territory
infarcts.

No abnormal extra-axial fluid collections. Basal cisterns are
patent.

Small to moderate RIGHT parietal scalp hematoma without subcutaneous
gas or radiopaque foreign bodies. No skull fracture. The included
ocular globes and orbital contents are non-suspicious. Moderate
paranasal sinus mucosal thickening, partially imaged. The mastoid
air cells are well aerated.
IMPRESSION: Small to moderate RIGHT parietal scalp hematoma.  No skull fracture.

Normal noncontrast CT head.

## 2017-08-31 ENCOUNTER — Ambulatory Visit: Payer: BLUE CROSS/BLUE SHIELD | Admitting: Sports Medicine

## 2017-08-31 ENCOUNTER — Other Ambulatory Visit: Payer: Self-pay | Admitting: Internal Medicine

## 2017-08-31 NOTE — Progress Notes (Deleted)
OFFICE VISIT NOTE Douglas Maxwell. [facility] [employer] [facility] at [facility] [phone]  Douglas Maxwell - 36 y.o. male MRN [id number]  Date of birth: 05-25-1981  Visit Date: 08/31/2017  PCP: [name], MD   Referred by: [name], MD  {Scribe Credentials} acting as scribe for Dr. Berline Chough.  SUBJECTIVE:  No chief complaint on file.  HPI: As below and per problem based documentation when appropriate.  HPI  ROS  Otherwise per HPI.  HISTORY & PERTINENT PRIOR DATA:  No specialty comments available. He reports that he has been smoking Cigarettes.  He has been smoking about 1.00 pack per day. He has never used smokeless tobacco. No results for input(s): HGBA1C, LABURIC in the last 8760 hours. Allergies reviewed per EMR Prior to Admission medications   Medication Sig Start Date End Date Taking? Authorizing Provider  cyclobenzaprine (FLEXERIL) 10 MG tablet Take 1 tablet (10 mg total) by mouth 2 (two) times daily as needed for muscle spasms. 02/17/17   Roxy Horseman, PA-C  Diclofenac Sodium (PENNSAID) 2 % SOLN Place 2 application onto the skin 2 (two) times daily. 05/15/16   [name], DO  ibuprofen (ADVIL,MOTRIN) 600 MG tablet TAKE 1 TABLET (600 MG TOTAL) BY MOUTH EVERY 6 (SIX) HOURS AS NEEDED. 08/14/17   [name], MD  predniSONE (DELTASONE) 50 MG tablet Take 1 tablet (50 mg total) by mouth daily. Patient not taking: Reported on 08/25/2017 02/20/17   [name], DO  Vitamin D, Ergocalciferol, (DRISDOL) 50000 units CAPS capsule TAKE ONE CAPSULE BY MOUTH EVERY 7 DAYS Patient not taking: Reported on 08/25/2017 05/12/17   [name], DO   Patient Active Problem List   Diagnosis Date Noted  . Posterior tibial tendon dysfunction, bilateral 08/25/2017  . Neck muscle spasm 02/20/2017  . Abnormality of gait 12/18/2016  . Rupture of posterior tibialis tendon, right, initial encounter 11/26/2016  . Meralgia paresthetica, left 09/30/2016  . Left  tibialis posterior tendinitis 05/15/2016  . 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 Left ankle pain 04/16/2016  . Preventative health care 07/19/2015  . Left cervical radiculopathy 07/19/2015  . GERD (gastroesophageal reflux disease)    Past Medical History:  Diagnosis Date  . GERD (gastroesophageal reflux disease)    Family History  Problem Relation Age of Onset  . Cancer Mother        lung  . Diabetes Father   . COPD Father    Past Surgical History:  Procedure Laterality Date  . TONSILLECTOMY  1990   Social History   Occupational History  . Not on file.   Social History Main Topics  . Smoking status: Current Every Day Smoker    Packs/day: 1.00    Types: Cigarettes  . Smokeless tobacco: Never Used  . Alcohol use No  . Drug use: No  . Sexual activity: Not on file    OBJECTIVE:  VS:  HT:    WT:   BMI:     BP:   HR: bpm  TEMP: ( )  RESP:  EXAM: No additional findings.   RADIOLOGY: [place] LIMITED JOINT SPACE STRUCTURES LOW LEFT Procedure: Real-time Ultrasound Guided Injection of right posterior  tibialis tendon sheath Device: GE Logiq Q7 Ultrasound guided injection is preferred based studies that show increased  duration, increased effect, greater accuracy, decreased procedural pain,  increased response rate, and decreased cost with ultrasound guided versus  blind injection.  Verbal informed consent obtained.  Time-out conducted.  Noted no overlying erythema, induration, or other signs of local  infection.  Skin prepped in a sterile fashion.  Local anesthesia: Topical Ethyl chloride.  With sterile technique and under real time ultrasound guidance:  With a  25-gauge half-inch needle patient was injected with a total of 0.5 mL of  0.5% Marcaine and 0.5 mL of Kenalog 40 mg/dL. Completed without difficulty  Pain immediately resolved suggesting accurate placement of the medication.   Advised to call if fevers/chills, erythema, induration,  drainage, or  persistent bleeding.  Images permanently stored and available for review in the ultrasound unit.   Impression: Technically successful ultrasound guided injection.  ASSESSMENT & PLAN:  { }

## 2017-09-01 ENCOUNTER — Encounter: Payer: Self-pay | Admitting: Sports Medicine

## 2017-09-01 ENCOUNTER — Ambulatory Visit (INDEPENDENT_AMBULATORY_CARE_PROVIDER_SITE_OTHER): Payer: BLUE CROSS/BLUE SHIELD | Admitting: Sports Medicine

## 2017-09-01 ENCOUNTER — Ambulatory Visit: Payer: BLUE CROSS/BLUE SHIELD | Admitting: Sports Medicine

## 2017-09-01 VITALS — BP 138/88 | HR 78 | Ht 69.0 in | Wt 272.6 lb

## 2017-09-01 DIAGNOSIS — M76821 Posterior tibial tendinitis, right leg: Secondary | ICD-10-CM | POA: Diagnosis not present

## 2017-09-01 DIAGNOSIS — M76822 Posterior tibial tendinitis, left leg: Secondary | ICD-10-CM | POA: Diagnosis not present

## 2017-09-01 NOTE — Progress Notes (Signed)
OFFICE VISIT NOTE Douglas [facility]. [facility] at [facility] [phone]  Douglas Maxwell - 36 y.o. male MRN [id number]  Date of birth: 1981-04-28  Visit Date: 09/01/2017  PCP: [name], MD   Referred by: [name], MD  Orlie Dakin, CMA acting as scribe for Dr. Berline Chough.  SUBJECTIVE:   Chief Complaint  Patient presents with  . bilateral foot pain   HPI: As below and per problem based documentation when appropriate.  Douglas Maxwell is an established patient with Dr. [name] presenting today for evaluation of posterior tibial tendon dysfunction. He was last seen by Dr. [name] 08/25/17 and received steroid injection. He has used orthotics in the past but they have broken down over time.     Review of Systems  Constitutional: Negative for chills and fever.  Respiratory: Negative for shortness of breath and wheezing.   Cardiovascular: Negative for chest pain and palpitations.  Neurological: Negative for dizziness, tingling and headaches.    Otherwise per HPI.  HISTORY & PERTINENT PRIOR DATA:  No specialty comments available. He reports that he has been smoking Cigarettes.  He has been smoking about 1.00 pack per day. He has never used smokeless tobacco. No results for input(s): HGBA1C, LABURIC in the last 8760 hours. Allergies reviewed per EMR Prior to Admission medications   Medication Sig Start Date End Date Taking? Authorizing Provider  cyclobenzaprine (FLEXERIL) 10 MG tablet Take 1 tablet (10 mg total) by mouth 2 (two) times daily as needed for muscle spasms. 02/17/17  Yes Roxy Horseman, PA-C  Diclofenac Sodium (PENNSAID) 2 % SOLN Place 2 application onto the skin 2 (two) times daily. 05/15/16  Yes [name], DO  ibuprofen (ADVIL,MOTRIN) 600 MG tablet Take 1 tablet (600 mg total) by mouth every 6 (six) hours as needed. **PT NEEDS APPT FOR ADDITIONAL REFILLS** 08/31/17  Yes [name], MD  predniSONE (DELTASONE) 50 MG tablet  Take 1 tablet (50 mg total) by mouth daily. 02/20/17  Yes [name], DO  Vitamin D, Ergocalciferol, (DRISDOL) 50000 units CAPS capsule TAKE ONE CAPSULE BY MOUTH EVERY 7 DAYS 05/12/17  Yes [name], DO   Patient Active Problem List   Diagnosis Date Noted  . Posterior tibial tendon dysfunction, bilateral 08/25/2017  . Neck muscle spasm 02/20/2017  . Abnormality of gait 12/18/2016  . Rupture of posterior tibialis tendon, right, initial encounter 11/26/2016  . Meralgia paresthetica, left 09/30/2016  . Left tibialis posterior tendinitis 05/15/2016  . 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 Left ankle pain 04/16/2016  . Preventative health care 07/19/2015  . Left cervical radiculopathy 07/19/2015  . GERD (gastroesophageal reflux disease)    Past Medical History:  Diagnosis Date  . GERD (gastroesophageal reflux disease)    Family History  Problem Relation Age of Onset  . Cancer Mother        lung  . Diabetes Father   . COPD Father    Past Surgical History:  Procedure Laterality Date  . TONSILLECTOMY  1990   Social History   Occupational History  . Not on file.   Social History Main Topics  . Smoking status: Current Every Day Smoker    Packs/day: 1.00    Types: Cigarettes  . Smokeless tobacco: Never Used  . Alcohol use No  . Drug use: No  . Sexual activity: Not on file    OBJECTIVE:  VS:  HT:5\' 9"  (175.3 cm)  WT:272 lb 9.6 oz (123.7 kg)  BMI:40.24    BP:138/88  HR:78bpm  TEMP: ( )  RESP:97 % EXAM: Findings:  Markedly abnormal feet with significant posterior tibialis insufficiency type I bilaterally right worse than left with marked loss of the longitudinal arch on the left and calcaneal valgus right greater than left.  No significant skin lesions.    RADIOLOGY: [place] LIMITED JOINT SPACE STRUCTURES LOW LEFT Procedure: Real-time Ultrasound Guided Injection of right posterior  tibialis tendon sheath Device: GE Logiq Q7 Ultrasound guided  injection is preferred based studies that show increased  duration, increased effect, greater accuracy, decreased procedural pain,  increased response rate, and decreased cost with ultrasound guided versus  blind injection.  Verbal informed consent obtained.  Time-out conducted.  Noted no overlying erythema, induration, or other signs of local  infection.  Skin prepped in a sterile fashion.  Local anesthesia: Topical Ethyl chloride.  With sterile technique and under real time ultrasound guidance:  With a  25-gauge half-inch needle patient was injected with a total of 0.5 mL of  0.5% Marcaine and 0.5 mL of Kenalog 40 mg/dL. Completed without difficulty  Pain immediately resolved suggesting accurate placement of the medication.   Advised to call if fevers/chills, erythema, induration, drainage, or  persistent bleeding.  Images permanently stored and available for review in the ultrasound unit.   Impression: Technically successful ultrasound guided injection.  ASSESSMENT & PLAN:     ICD-10-CM   1. Posterior tibial tendon dysfunction, bilateral M76.821 Misc procedure   [id number]    ================================================================= Posterior tibial tendon dysfunction, bilateral New custom cushion orthotics fabricated today with overall good improvement.  Recommend new shoes as his shoes have multiple cuts and are falling apart.  Happy to see him back at any point will defer to Dr. [name] for further management.  PROCEDURE: CUSTOM ORTHOTIC FABRICATION Patient's underlying musculoskeletal conditions are directly related to poor biomechanics and will benefit from a functional custom orthotic.  There are no significant foot deformities that complicate the use of a custom orthotic.  The patient was fitted for a standard, cushioned, semi-rigid orthotic. The orthotic was heated & placed on the orthotic stand. The patient was positioned in subtalar neutral position and 10 of ankle  dorsiflexion and weight bearing stance on the heated orthotic blank. After completion of the molding a base was applied to the orthotic blank. The orthotic was ground to a stable position for weightbearing. The patient ambulated in these and reported they were comfortable without pressure spots.              BLANK:  Size 13 - Standard Cushioned                 BASE:  Blue EVA      POSTINGS:  n/a  =================================================================  Follow-up: Return if symptoms worsen or fail to improve.   CMA/ATC served as Neurosurgeon during this visit. History, Physical, and Plan performed by medical provider. Documentation and orders reviewed and attested to.      Gaspar Bidding, DO    [facility] Phys[facility]

## 2017-09-01 NOTE — Procedures (Signed)
PROCEDURE: CUSTOM ORTHOTIC FABRICATION Patient's underlying musculoskeletal conditions are directly related to poor biomechanics and will benefit from a functional custom orthotic.  There are no significant foot deformities that complicate the use of a custom orthotic.  The patient was fitted for a standard, cushioned, semi-rigid orthotic. The orthotic was heated & placed on the orthotic stand. The patient was positioned in subtalar neutral position and 10 of ankle dorsiflexion and weight bearing stance on the heated orthotic blank. After completion of the molding a base was applied to the orthotic blank. The orthotic was ground to a stable position for weightbearing. The patient ambulated in these and reported they were comfortable without pressure spots.              BLANK:  Size 13 - Standard Cushioned                 BASE:  Blue EVA      POSTINGS:  n/a  

## 2017-09-01 NOTE — Assessment & Plan Note (Signed)
New custom cushion orthotics fabricated today with overall good improvement.  Recommend new shoes as his shoes have multiple cuts and are falling apart.  Happy to see him back at any point will defer to Dr. [name] for further management.

## 2017-09-17 ENCOUNTER — Other Ambulatory Visit: Payer: Self-pay | Admitting: Internal Medicine

## 2017-10-01 ENCOUNTER — Ambulatory Visit (INDEPENDENT_AMBULATORY_CARE_PROVIDER_SITE_OTHER): Payer: BLUE CROSS/BLUE SHIELD | Admitting: Family Medicine

## 2017-10-01 ENCOUNTER — Encounter: Payer: Self-pay | Admitting: Family Medicine

## 2017-10-01 DIAGNOSIS — M216X2 Other acquired deformities of left foot: Secondary | ICD-10-CM | POA: Diagnosis not present

## 2017-10-01 MED ORDER — GABAPENTIN 100 MG PO CAPS: 200.0000 mg | ORAL_CAPSULE | Freq: Every day | ORAL | 3 refills | Status: DC

## 2017-10-01 NOTE — Patient Instructions (Signed)
Good to see you  Ice is your friend still  Keep up with the exercises Gabapentin 200 mg at night could help if nerve related.  Iron 65 mg 3 times a week with 500mg  of vitamin C Have an appointment line up in 6 weeks in case.

## 2017-10-01 NOTE — Progress Notes (Signed)
[facility]ana Scale[facility] Liz Pinho D.O. [facility] [address] [place], [place] [address] Phone: [phone] Subjective:    I'm seeing this patient by the request  of:    CC: ankle pain   BJY:NWGNFAOZHYHPI:Subjective  Douglas Maxwell is a 36 y.o. male coming in with complaint of  Bilateral foot and ankle pain.  Patient was seen before and had more of a posterior tibialis tendinitis with severe breakdown of the longitudinal arch.  Patient was fitted into a different custom orthotics secondary to abuse of his previous ones.  They seem to follow part to easily.  Patient states 90% better overall.  Still with some mild discomfort on the right side.  No pain on the left side.  Feels that the new orthotics are helping.  Feels the injections helped out significantly.     Past Medical History:  Diagnosis Date  . GERD (gastroesophageal reflux disease)    Past Surgical History:  Procedure Laterality Date  . TONSILLECTOMY  1990   Social History   Socioeconomic History  . Marital status: Single    Spouse name: None  . Number of children: None  . Years of education: None  . Highest education level: None  Social Needs  . Financial resource strain: None  . Food insecurity - worry: None  . Food insecurity - inability: None  . Transportation needs - medical: None  . Transportation needs - non-medical: None  Occupational History  . None  Tobacco Use  . Smoking status: Current Every Day Smoker    Packs/day: 1.00    Types: Cigarettes  . Smokeless tobacco: Never Used  Substance and Sexual Activity  . Alcohol use: No  . Drug use: No  . Sexual activity: None  Other Topics Concern  . None  Social History Narrative  . None   Allergies  Allergen Reactions  . Antihistamines, Diphenhydramine-Type Anaphylaxis   Family History  Problem Relation Age of Onset  . Cancer Mother        lung  . Diabetes Father   . COPD Father      Past medical history, social, surgical and family history all reviewed in  electronic medical record.  No pertanent information unless stated regarding to the chief complaint.   Review of Systems:Review of systems updated and as accurate as of 10/01/17  No headache, visual changes, nausea, vomiting, diarrhea, constipation, dizziness, abdominal pain, skin rash, fevers, chills, night sweats, weight loss, swollen lymph nodes, body aches, joint swelling, muscle aches, chest pain, shortness of breath, mood changes.   Objective  Blood pressure 110/82, pulse 70, height 5\' 9"  (1.753 m), weight 272 lb (123.4 kg), SpO2 98 %. Systems examined below as of 10/01/17   General: No apparent distress alert and oriented x3 mood and affect normal, dressed appropriately.  HEENT: Pupils equal, extraocular movements intact  Respiratory: Patient's speak in full sentences and does not appear short of breath  Cardiovascular: No lower extremity edema, non tender, no erythema  Skin: Warm dry intact with no signs of infection or rash on extremities or on axial skeleton.  Abdomen: Soft nontender  Neuro: Cranial nerves II through XII are intact, neurovascularly intact in all extremities with 2+ DTRs and 2+ pulses.  Lymph: No lymphadenopathy of posterior or anterior cervical chain or axillae bilaterally.  Gait mild antalgic MSK:  Non tender with full range of motion and good stability and symmetric strength and tone of shoulders, elbows, wrist, hip, knee bilaterally.  Bilateral ankle still shows significant insufficiency  of the posterior tibialis tendon.  Overpronation of the hindfoot noted.  Breakdown of the transverse arch as well.  Poor foot hygiene noted.   Impression and Recommendations:     This case required medical decision making of moderate complexity.      Note: This dictation was prepared with Dragon dictation along with smaller phrase technology. Any transcriptional errors that result from this process are unintentional.

## 2017-10-01 NOTE — Assessment & Plan Note (Signed)
Doing relatively well with the new orthotics and did well with the injections.  Patient will probably get recurrent tendinitis is from time to time but patient does not do the exercises regularly.  We can repeat injections as necessary.  Differential also include some mild tarsal tunnel.  Encourage patient to try gabapentin at night.  Discussed icing regimen, home exercises of which activities to avoid.

## 2017-11-18 NOTE — Progress Notes (Signed)
[name] D.O. [facility] [address] [place], [place] [address] Phone: [phone] Subjective:      CC: Bilateral ankle pain f/u   BJY:NWGNFAOZHYHPI:Subjective  Douglas Maxwell is a 37 y.o. male coming in with complaint of bilateral ankle pain.  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  Started on gabapentin as well.  Patient was to continue with conservative therapy otherwise.  Patient states that the orthotics are working well and have decreased his pain. His left still bothers him more than the right. He does get some pain towards the end of his shift.       Past Medical History:  Diagnosis Date  . GERD (gastroesophageal reflux disease)    Past Surgical History:  Procedure Laterality Date  . TONSILLECTOMY  1990   Social History   Socioeconomic History  . Marital status: Single    Spouse name: None  . Number of children: None  . Years of education: None  . Highest education level: None  Social Needs  . Financial resource strain: None  . Food insecurity - worry: None  . Food insecurity - inability: None  . Transportation needs - medical: None  . Transportation needs - non-medical: None  Occupational History  . None  Tobacco Use  . Smoking status: Current Every Day Smoker    Packs/day: 1.00    Types: Cigarettes  . Smokeless tobacco: Never Used  Substance and Sexual Activity  . Alcohol use: No  . Drug use: No  . Sexual activity: None  Other Topics Concern  . None  Social History Narrative  . None   Allergies  Allergen Reactions  . Antihistamines, Diphenhydramine-Type Anaphylaxis   Family History  Problem Relation Age of Onset  . Cancer Mother        lung  . Diabetes Father   . COPD Father      Past medical history, social, surgical and family history all reviewed in electronic medical record.  No pertanent information unless stated regarding to the chief complaint.   Review of Systems:Review of systems updated and as accurate  as of 11/19/17  No headache, visual changes, nausea, vomiting, diarrhea, constipation, dizziness, abdominal pain, skin rash, fevers, chills, night sweats, weight loss, swollen lymph nodes, body aches, joint swelling, muscle aches, chest pain, shortness of breath, mood changes.   Objective  Blood pressure 110/78, pulse 76, height 5\' 9"  (1.753 m), weight 273 lb (123.8 kg), SpO2 98 %. Systems examined below as of 11/19/17   General: No apparent distress alert and oriented x3 mood and affect normal, dressed appropriately.  HEENT: Pupils equal, extraocular movements intact  Respiratory: Patient's speak in full sentences and does not appear short of breath  Cardiovascular: No lower extremity edema, non tender, no erythema  Skin: Warm dry intact with no signs of infection or rash on extremities or on axial skeleton.  Abdomen: Soft nontender  Neuro: Cranial nerves II through XII are intact, neurovascularly intact in all extremities with 2+ DTRs and 2+ pulses.  Lymph: No lymphadenopathy of posterior or anterior cervical chain or axillae bilaterally.  Gait normal with good balance and coordination.  MSK:  Non tender with full range of motion and good stability and symmetric strength and tone of shoulders, elbows, wrist, hip, knee bilaterally.  Ankle exam shows the patient still has significant insufficiency of the posterior tibialis with over pronation of the hindfoot.  No significant swelling.  Mild increase in range of motion of the ankle.  Still mild positive tarsal tunnel left greater than right.  Neurovascularly intact distally.      Impression and Recommendations:     This case required medical decision making of moderate complexity.      Note: This dictation was prepared with Dragon dictation along with smaller phrase technology. Any transcriptional errors that result from this process are unintentional.

## 2017-11-19 ENCOUNTER — Encounter: Payer: Self-pay | Admitting: Family Medicine

## 2017-11-19 ENCOUNTER — Ambulatory Visit (INDEPENDENT_AMBULATORY_CARE_PROVIDER_SITE_OTHER): Payer: BLUE CROSS/BLUE SHIELD | Admitting: Family Medicine

## 2017-11-19 DIAGNOSIS — M76821 Posterior tibial tendinitis, right leg: Secondary | ICD-10-CM | POA: Diagnosis not present

## 2017-11-19 DIAGNOSIS — M76822 Posterior tibial tendinitis, left leg: Secondary | ICD-10-CM | POA: Diagnosis not present

## 2017-11-19 NOTE — Patient Instructions (Addendum)
Good to see you  Look up a [place]asburg brace and see what you think Gabapentin 1- 3 pills at night (or AM) if needed Keep up with the exercises most days of the week Ice is your friend See me when you need me

## 2017-11-19 NOTE — Assessment & Plan Note (Signed)
Doing well with the conservative therapy at this time.  Encourage patient to continue the gabapentin intermittently, topical anti-inflammatories, icing regimen and home exercises. The patient is welcome follow-up with me as needed

## 2017-11-23 ENCOUNTER — Other Ambulatory Visit: Payer: Self-pay | Admitting: *Deleted

## 2017-11-23 MED ORDER — GABAPENTIN 100 MG PO CAPS: 200.0000 mg | ORAL_CAPSULE | Freq: Every day | ORAL | 1 refills | Status: DC

## 2017-11-23 NOTE — Telephone Encounter (Signed)
Refill done.  

## 2017-12-10 ENCOUNTER — Encounter: Payer: Self-pay | Admitting: Internal Medicine

## 2017-12-10 ENCOUNTER — Ambulatory Visit (INDEPENDENT_AMBULATORY_CARE_PROVIDER_SITE_OTHER): Payer: BLUE CROSS/BLUE SHIELD | Admitting: Internal Medicine

## 2017-12-10 ENCOUNTER — Other Ambulatory Visit (INDEPENDENT_AMBULATORY_CARE_PROVIDER_SITE_OTHER): Payer: BLUE CROSS/BLUE SHIELD

## 2017-12-10 VITALS — BP 118/82 | HR 85 | Temp 97.8°F | Ht 69.0 in | Wt 272.0 lb

## 2017-12-10 DIAGNOSIS — Z114 Encounter for screening for human immunodeficiency virus [HIV]: Secondary | ICD-10-CM

## 2017-12-10 DIAGNOSIS — R739 Hyperglycemia, unspecified: Secondary | ICD-10-CM

## 2017-12-10 DIAGNOSIS — Z0000 Encounter for general adult medical examination without abnormal findings: Secondary | ICD-10-CM

## 2017-12-10 LAB — HEPATIC FUNCTION PANEL
ALK PHOS: 64 U/L (ref 39–117)
ALT: 57 U/L — AB (ref 0–53)
AST: 32 U/L (ref 0–37)
Albumin: 4.4 g/dL (ref 3.5–5.2)
BILIRUBIN DIRECT: 0.1 mg/dL (ref 0.0–0.3)
BILIRUBIN TOTAL: 0.5 mg/dL (ref 0.2–1.2)
TOTAL PROTEIN: 7.6 g/dL (ref 6.0–8.3)

## 2017-12-10 LAB — TSH: TSH: 1.74 u[IU]/mL (ref 0.35–4.50)

## 2017-12-10 LAB — URINALYSIS, ROUTINE W REFLEX MICROSCOPIC
Bilirubin Urine: NEGATIVE
HGB URINE DIPSTICK: NEGATIVE
KETONES UR: NEGATIVE
LEUKOCYTES UA: NEGATIVE
NITRITE: NEGATIVE
RBC / HPF: NONE SEEN (ref 0–?)
Specific Gravity, Urine: 1.02 (ref 1.000–1.030)
TOTAL PROTEIN, URINE-UPE24: NEGATIVE
Urine Glucose: NEGATIVE
Urobilinogen, UA: 0.2 (ref 0.0–1.0)
WBC, UA: NONE SEEN (ref 0–?)
pH: 7 (ref 5.0–8.0)

## 2017-12-10 LAB — BASIC METABOLIC PANEL
BUN: 13 mg/dL (ref 6–23)
CALCIUM: 9.5 mg/dL (ref 8.4–10.5)
CO2: 27 mEq/L (ref 19–32)
Chloride: 103 mEq/L (ref 96–112)
Creatinine, Ser: 0.69 mg/dL (ref 0.40–1.50)
GFR: 138.41 mL/min (ref >60.00)
GLUCOSE: 98 mg/dL (ref 70–99)
POTASSIUM: 3.9 meq/L (ref 3.5–5.1)
Sodium: 139 mEq/L (ref 135–145)

## 2017-12-10 LAB — CBC WITH DIFFERENTIAL/PLATELET
BASOS ABS: 0.1 10*3/uL (ref 0.0–0.1)
Basophils Relative: 0.5 % (ref 0.0–3.0)
EOS ABS: 0.3 10*3/uL (ref 0.0–0.7)
Eosinophils Relative: 3.2 % (ref 0.0–5.0)
HEMATOCRIT: 44.6 % (ref 39.0–52.0)
HEMOGLOBIN: 15.3 g/dL (ref 13.0–17.0)
LYMPHS PCT: 34.8 % (ref 12.0–46.0)
Lymphs Abs: 3.5 10*3/uL (ref 0.7–4.0)
MCHC: 34.3 g/dL (ref 30.0–36.0)
MCV: 86.9 fl (ref 78.0–100.0)
MONO ABS: 0.8 10*3/uL (ref 0.1–1.0)
Monocytes Relative: 7.8 % (ref 3.0–12.0)
NEUTROS ABS: 5.4 10*3/uL (ref 1.4–7.7)
Neutrophils Relative %: 53.7 % (ref 43.0–77.0)
PLATELETS: 314 10*3/uL (ref 150.0–400.0)
RBC: 5.13 Mil/uL (ref 4.22–5.81)
RDW: 14.1 % (ref 11.5–15.5)
WBC: 10.1 10*3/uL (ref 4.0–10.5)

## 2017-12-10 LAB — LIPID PANEL
CHOLESTEROL: 190 mg/dL (ref 0–200)
HDL: 38.6 mg/dL — ABNORMAL LOW (ref >39.00)
LDL Cholesterol: 122 mg/dL — ABNORMAL HIGH (ref 0–99)
NONHDL: 150.96
Total CHOL/HDL Ratio: 5
Triglycerides: 147 mg/dL (ref 0.0–149.0)
VLDL: 29.4 mg/dL (ref 0.0–40.0)

## 2017-12-10 LAB — HEMOGLOBIN A1C: Hgb A1c MFr Bld: 6.2 % (ref 4.6–6.5)

## 2017-12-10 NOTE — Patient Instructions (Signed)

## 2017-12-10 NOTE — Assessment & Plan Note (Signed)
stable overall by history and exam, recent data reviewed with pt, and pt to continue medical treatment as before,  to f/u any worsening symptoms or concerns Lab Results  Component Value Date   HGBA1C 5.8 07/19/2015

## 2017-12-10 NOTE — Progress Notes (Signed)
Subjective:    Patient ID: Douglas Maxwell, male    DOB: 1981/07/14, 37 y.o.   MRN: [id number]  HPI  Here for wellness and f/u;  Overall doing ok;  Pt denies Chest pain, worsening SOB, DOE, wheezing, orthopnea, PND, worsening LE edema, palpitations, dizziness or syncope.  Pt denies neurological change such as new headache, facial or extremity weakness.  Pt denies polydipsia, polyuria, or low sugar symptoms. Pt states overall good compliance with treatment and medications, good tolerability, and has been trying to follow appropriate diet.  Pt denies worsening depressive symptoms, suicidal ideation or panic. No fever, night sweats, wt loss, loss of appetite, or other constitutional symptoms.  Pt states good ability with ADL's, has low fall risk, home safety reviewed and adequate, no other significant changes in hearing or vision, and only occasionally active with exercise. 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  No other complaints or interval hx. Declines flu shot  Does about 8 miles walking per night shift. MSk issues from the past yr overall much improved, has f/u with sports med soon.   Wt Readings from Last 3 Encounters:  12/10/17 272 lb (123.4 kg)  11/19/17 273 lb (123.8 kg)  10/01/17 272 lb (123.4 kg)   Past Medical History:  Diagnosis Date  . GERD (gastroesophageal reflux disease)    Past Surgical History:  Procedure Laterality Date  . TONSILLECTOMY  1990    reports that he has been smoking cigarettes.  He has been smoking about 1.00 pack per day. he has never used smokeless tobacco. He reports that he does not drink alcohol or use drugs. family history includes COPD in his father; Cancer in his mother; Diabetes in his father. Allergies  Allergen Reactions  . Antihistamines, Diphenhydramine-Type Anaphylaxis   Current Outpatient Medications on File Prior to Visit  Medication Sig Dispense Refill  . cyclobenzaprine (FLEXERIL) 10 MG tablet  Take 1 tablet (10 mg total) by mouth 2 (two) times daily as needed for muscle spasms. 20 tablet 0  . Diclofenac Sodium (PENNSAID) 2 % SOLN Place 2 application onto the skin 2 (two) times daily. 112 g 3  . gabapentin (NEURONTIN) 100 MG capsule Take 2 capsules (200 mg total) by mouth at bedtime. 180 capsule 1  . ibuprofen (ADVIL,MOTRIN) 600 MG tablet Take 1 tablet (600 mg total) by mouth every 6 (six) hours as needed. **PT NEEDS PHYSICAL FOR ADDITIONAL REFILLS** 60 tablet 0  . predniSONE (DELTASONE) 50 MG tablet Take 1 tablet (50 mg total) by mouth daily. 5 tablet 0  . Vitamin D, Ergocalciferol, (DRISDOL) 50000 units CAPS capsule TAKE ONE CAPSULE BY MOUTH EVERY 7 DAYS 12 capsule 0   No current facility-administered medications on file prior to visit.    Review of Systems Constitutional: Negative for other unusual diaphoresis, sweats, appetite or weight changes HENT: Negative for other worsening hearing loss, ear pain, facial swelling, mouth sores or neck stiffness.   Eyes: Negative for other worsening pain, redness or other visual disturbance.  Respiratory: Negative for other stridor or swelling Cardiovascular: Negative for other palpitations or other chest pain  Gastrointestinal: Negative for worsening diarrhea or loose stools, blood in stool, distention or other pain Genitourinary: Negative for hematuria, flank pain or other change in urine volume.  Musculoskeletal: Negative for myalgias or other joint swelling.  Skin: Negative for other color change, or other wound or worsening drainage.  Neurological: Negative for other syncope or numbness. Hematological: Negative for other  adenopathy or swelling Psychiatric/Behavioral: Negative for hallucinations, other worsening agitation, SI, self-injury, or new decreased concentration All other system neg per pt    Objective:   Physical Exam BP 118/82   Pulse 85   Temp 97.8 F (36.6 C) (Oral)   Ht 5\' 9"  (1.753 m)   Wt 272 lb (123.4 kg)   SpO2  98%   BMI 40.17 kg/m  VS noted,  Constitutional: Pt is oriented to person, place, and time. Appears well-developed and well-nourished, in no significant distress and comfortable Head: Normocephalic and atraumatic  Eyes: Conjunctivae and EOM are normal. Pupils are equal, round, and reactive to light Right Ear: External ear normal without discharge Left Ear: External ear normal without discharge Nose: Nose without discharge or deformity Mouth/Throat: Oropharynx is without other ulcerations and moist  Neck: Normal range of motion. Neck supple. No JVD present. No tracheal deviation present or significant neck LA or mass Cardiovascular: Normal rate, regular rhythm, normal heart sounds and intact distal pulses.   Pulmonary/Chest: WOB normal and breath sounds without rales or wheezing  Abdominal: Soft. Bowel sounds are normal. NT. No HSM  Musculoskeletal: Normal range of motion. Exhibits no edema Lymphadenopathy: Has no other cervical adenopathy.  Neurological: Pt is alert and oriented to person, place, and time. Pt has normal reflexes. No cranial nerve deficit. Motor grossly intact, Gait intact Skin: Skin is warm and dry. No rash noted or new ulcerations Psychiatric:  Has normal mood and affect. Behavior is normal without agitation No other exam findings Lab Results  Component Value Date   WBC 11.2 (H) 07/19/2015   HGB 14.3 07/19/2015   HCT 42.0 07/19/2015   PLT 301.0 07/19/2015   GLUCOSE 118 (H) 07/19/2015   CHOL 175 07/19/2015   TRIG 186.0 (H) 07/19/2015   HDL 35.80 (L) 07/19/2015   LDLCALC 102 (H) 07/19/2015   ALT 53 07/19/2015   AST 27 07/19/2015   NA 143 07/19/2015   K 3.6 07/19/2015   CL 107 07/19/2015   CREATININE 0.78 07/19/2015   BUN 15 07/19/2015   CO2 26 07/19/2015   TSH 1.71 07/19/2015   HGBA1C 5.8 07/19/2015          Assessment & Plan:

## 2017-12-10 NOTE — Assessment & Plan Note (Signed)

## 2017-12-11 ENCOUNTER — Encounter: Payer: Self-pay | Admitting: Sports Medicine

## 2017-12-11 LAB — HIV ANTIBODY (ROUTINE TESTING W REFLEX): HIV: NONREACTIVE

## 2017-12-13 ENCOUNTER — Other Ambulatory Visit: Payer: Self-pay | Admitting: Family Medicine

## 2017-12-13 ENCOUNTER — Other Ambulatory Visit: Payer: Self-pay | Admitting: Internal Medicine

## 2017-12-14 ENCOUNTER — Other Ambulatory Visit: Payer: Self-pay

## 2017-12-14 MED ORDER — IBUPROFEN 600 MG PO TABS: 600.0000 mg | ORAL_TABLET | Freq: Four times a day (QID) | ORAL | 0 refills | Status: DC | PRN

## 2017-12-14 MED ORDER — VITAMIN D (ERGOCALCIFEROL) 1.25 MG (50000 UNIT) PO CAPS: ORAL_CAPSULE | ORAL | 0 refills | Status: DC

## 2017-12-26 NOTE — Progress Notes (Signed)
[name] D.O. [facility] [address] [place], [place] [address] Phone: [phone] Subjective:     CC: Ankle and foot pain follow-up  ZHY:QMVHQIONGEHPI:Subjective  Douglas Maxwell is a 37 y.o. male coming in with complaint of ankle pain.  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  Has been put in custom orthotics, doing home exercises and has had intermittent injections.  Patient states worsening pain at this time.  Patient states that it is making it difficult to walk.  pain affecting daily activities.  Rates the severity of pain is 9 out of 10.  Just worsening of previous symptoms       Past Medical History:  Diagnosis Date  . GERD (gastroesophageal reflux disease)    Past Surgical History:  Procedure Laterality Date  . TONSILLECTOMY  1990   Social History   Socioeconomic History  . Marital status: Single    Spouse name: None  . Number of children: None  . Years of education: None  . Highest education level: None  Social Needs  . Financial resource strain: None  . Food insecurity - worry: None  . Food insecurity - inability: None  . Transportation needs - medical: None  . Transportation needs - non-medical: None  Occupational History  . None  Tobacco Use  . Smoking status: Current Every Day Smoker    Packs/day: 1.00    Types: Cigarettes  . Smokeless tobacco: Never Used  Substance and Sexual Activity  . Alcohol use: No  . Drug use: No  . Sexual activity: None  Other Topics Concern  . None  Social History Narrative  . None   Allergies  Allergen Reactions  . Antihistamines, Diphenhydramine-Type Anaphylaxis   Family History  Problem Relation Age of Onset  . Cancer Mother        lung  . Diabetes Father   . COPD Father      Past medical history, social, surgical and family history all reviewed in electronic medical record.  No pertanent information unless stated regarding to the chief complaint.   Review of Systems:Review of  systems updated and as accurate as of 12/28/17  No headache, visual changes, nausea, vomiting, diarrhea, constipation, dizziness, abdominal pain, skin rash, fevers, chills, night sweats, weight loss, swollen lymph nodes, body aches, joint swelling, muscle aches, chest pain, shortness of breath, mood changes.   Objective  Blood pressure 134/88, pulse 87, height 5\' 9"  (1.753 m), weight 271 lb (122.9 kg), SpO2 97 %. Systems examined below as of 12/28/17   General: No apparent distress alert and oriented x3 mood and affect normal, dressed appropriately.  HEENT: Pupils equal, extraocular movements intact  Respiratory: Patient's speak in full sentences and does not appear short of breath  Cardiovascular: No lower extremity edema, non tender, no erythema  Skin: Warm dry intact with no signs of infection or rash on extremities or on axial skeleton.  Abdomen: Soft nontender  Neuro: Cranial nerves II through XII are intact, neurovascularly intact in all extremities with 2+ DTRs and 2+ pulses.  Lymph: No lymphadenopathy of posterior or anterior cervical chain or axillae bilaterally.  Gait normal with good balance and coordination.  MSK:  Non tender with full range of motion and good stability and symmetric strength and tone of shoulders, elbows, wrist, hip, knee bilaterally.  Ankle: Right Swelling on the right posterior tibialis tendon noted Range of motion is full in all directions. Strength is 5/5 in all directions. Stable lateral and medial ligaments;  squeeze test and kleiger test unremarkable; tender over the right posterior tibialis tendon Talar dome nontender; No pain at base of 5th MT; No tenderness over cuboid; No tenderness over N spot or navicular prominence No tenderness on posterior aspects of lateral and medial malleolus No sign of peroneal tendon subluxations or tenderness to palpation Negative tarsal tunnel tinel's Able to walk 4 steps. Contralateral ankle shows also severe  overpronation but no tenderness or swelling over the posterior tibialis tendon.  Procedure: Real-time Ultrasound Guided Injection of right posterior tibialis tendon sheath Device: GE Logiq Q7 Ultrasound guided injection is preferred based studies that show increased duration, increased effect, greater accuracy, decreased procedural pain, increased response rate, and decreased cost with ultrasound guided versus blind injection.  Verbal informed consent obtained.  Time-out conducted.  Noted no overlying erythema, induration, or other signs of local infection.  Skin prepped in a sterile fashion.  Local anesthesia: Topical Ethyl chloride.  With sterile technique and under real time ultrasound guidance: With a 25-gauge half inch needle was injected with a total of 0.5 cc of 0.5% Marcaine and 0.5 cc of Kenalog 40 mg/mL Completed without difficulty  Pain immediately resolved suggesting accurate placement of the medication.  Advised to call if fevers/chills, erythema, induration, drainage, or persistent bleeding.  Images permanently stored and available for review in the ultrasound unit.  Impression: Technically successful ultrasound guided injection.   Impression and Recommendations:     This case required medical decision making of moderate complexity.      Note: This dictation was prepared with Dragon dictation along with smaller phrase technology. Any transcriptional errors that result from this process are unintentional.

## 2017-12-28 ENCOUNTER — Ambulatory Visit (INDEPENDENT_AMBULATORY_CARE_PROVIDER_SITE_OTHER): Payer: BLUE CROSS/BLUE SHIELD | Admitting: Family Medicine

## 2017-12-28 ENCOUNTER — Ambulatory Visit: Payer: Self-pay

## 2017-12-28 ENCOUNTER — Encounter: Payer: Self-pay | Admitting: Family Medicine

## 2017-12-28 VITALS — BP 134/88 | HR 87 | Ht 69.0 in | Wt 271.0 lb

## 2017-12-28 DIAGNOSIS — G8929 Other chronic pain: Secondary | ICD-10-CM

## 2017-12-28 DIAGNOSIS — M25571 Pain in right ankle and joints of right foot: Secondary | ICD-10-CM

## 2017-12-28 DIAGNOSIS — S96811A Strain of other specified muscles and tendons at ankle and foot level, right foot, initial encounter: Secondary | ICD-10-CM | POA: Diagnosis not present

## 2017-12-28 DIAGNOSIS — S86111A Strain of other muscle(s) and tendon(s) of posterior muscle group at lower leg level, right leg, initial encounter: Secondary | ICD-10-CM

## 2017-12-28 NOTE — Patient Instructions (Signed)
Good to see you  Injected the area as well.  Stay active.  Continue the same everything else.  Ice is your friend.  Make an appointment in 4 weeks.

## 2017-12-28 NOTE — Assessment & Plan Note (Signed)
Patient given an injection today.  This is secondary to worsening symptoms.  Encourage patient to continue to use the custom orthotics on a more regular basis.  Patient is in sandals today.  We discussed icing regimen, topical anti-inflammatories, home exercises.  Patient declined formal physical therapy.  Patient will come back and see me again 4 weeks

## 2018-01-22 NOTE — Progress Notes (Signed)
Tawana ScaleZach Nahun Kronberg D.O. [facility] [address] [place], [place] [address] Phone: [phone] Subjective:      CC: Ankle pain follow-up  BJY:NWGNFAOZHYHPI:Subjective  Douglas Maxwell is a 37 y.o. male coming in with complaint of right ankle pain.  Patient has known posterior tibialis insufficiency.  Was injection of the right side December 28, 2017.  Patient though has otherwise been doing relatively good.  Patient states doing approximately 85% better.  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  Happy with this.  Happy with the results.  Doing home exercises and wearing the custom orthotics.    Past Medical History:  Diagnosis Date  . GERD (gastroesophageal reflux disease)    Past Surgical History:  Procedure Laterality Date  . TONSILLECTOMY  1990   Social History   Socioeconomic History  . Marital status: Single    Spouse name: None  . Number of children: None  . Years of education: None  . Highest education level: None  Social Needs  . Financial resource strain: None  . Food insecurity - worry: None  . Food insecurity - inability: None  . Transportation needs - medical: None  . Transportation needs - non-medical: None  Occupational History  . None  Tobacco Use  . Smoking status: Current Every Day Smoker    Packs/day: 1.00    Types: Cigarettes  . Smokeless tobacco: Never Used  Substance and Sexual Activity  . Alcohol use: No  . Drug use: No  . Sexual activity: None  Other Topics Concern  . None  Social History Narrative  . None   Allergies  Allergen Reactions  . Antihistamines, Diphenhydramine-Type Anaphylaxis   Family History  Problem Relation Age of Onset  . Cancer Mother        lung  . Diabetes Father   . COPD Father      Past medical history, social, surgical and family history all reviewed in electronic medical record.  No pertanent information unless stated regarding to the chief complaint.   Review of Systems:Review  of systems updated and as accurate as of 01/25/18  No headache, visual changes, nausea, vomiting, diarrhea, constipation, dizziness, abdominal pain, skin rash, fevers, chills, night sweats, weight loss, swollen lymph nodes, body aches, joint swelling, muscle aches, chest pain, shortness of breath, mood changes.   Objective  Blood pressure 124/90, pulse 72, height 5\' 9"  (1.753 m), weight 275 lb (124.7 kg), SpO2 98 %. Systems examined below as of 01/25/18   General: No apparent distress alert and oriented x3 mood and affect normal, dressed appropriately.  HEENT: Pupils equal, extraocular movements intact  Respiratory: Patient's speak in full sentences and does not appear short of breath  Cardiovascular: No lower extremity edema, non tender, no erythema  Skin: Warm dry intact with no signs of infection or rash on extremities or on axial skeleton.  Abdomen: Soft nontender  Neuro: Cranial nerves II through XII are intact, neurovascularly intact in all extremities with 2+ DTRs and 2+ pulses.  Lymph: No lymphadenopathy of posterior or anterior cervical chain or axillae bilaterally.  Gait normal with good balance and coordination.  MSK:  Non tender with full range of motion and good stability and symmetric strength and tone of shoulders, elbows, wrist, hip, knee bilaterally.  Ankle: Right No visible erythema or swelling.  Patient does have insufficiency of the posterior tibialis tendon with pes planus and overpronation of the hindfoot Range of motion is full in  all directions. Strength is 5/5 in all directions. Stable lateral and medial ligaments; squeeze test and kleiger test unremarkable; Talar dome nontender; No pain at base of 5th MT; No tenderness over cuboid; No tenderness over N spot or navicular prominence No tenderness on posterior aspects of lateral and medial malleolus No sign of peroneal tendon subluxations or tenderness to palpation Negative tarsal tunnel tinel's Able to walk 4  steps. Contralateral ankle also shows overpronation of the foot but nontender.    Impression and Recommendations:     This case required medical decision making of moderate complexity.      Note: This dictation was prepared with Dragon dictation along with smaller phrase technology. Any transcriptional errors that result from this process are unintentional.

## 2018-01-25 ENCOUNTER — Ambulatory Visit (INDEPENDENT_AMBULATORY_CARE_PROVIDER_SITE_OTHER): Payer: BLUE CROSS/BLUE SHIELD | Admitting: Family Medicine

## 2018-01-25 ENCOUNTER — Encounter: Payer: Self-pay | Admitting: Family Medicine

## 2018-01-25 DIAGNOSIS — M76821 Posterior tibial tendinitis, right leg: Secondary | ICD-10-CM | POA: Diagnosis not present

## 2018-01-25 DIAGNOSIS — M76822 Posterior tibial tendinitis, left leg: Secondary | ICD-10-CM

## 2018-01-25 NOTE — Assessment & Plan Note (Signed)
Better after the injections.  Continue conservative therapy.  Follow-up as needed

## 2018-02-02 ENCOUNTER — Other Ambulatory Visit: Payer: Self-pay | Admitting: Internal Medicine

## 2018-03-26 ENCOUNTER — Other Ambulatory Visit: Payer: Self-pay | Admitting: Internal Medicine

## 2018-04-27 ENCOUNTER — Encounter: Payer: Self-pay | Admitting: Internal Medicine

## 2018-04-27 ENCOUNTER — Ambulatory Visit (INDEPENDENT_AMBULATORY_CARE_PROVIDER_SITE_OTHER): Payer: BLUE CROSS/BLUE SHIELD | Admitting: Internal Medicine

## 2018-04-27 VITALS — BP 126/86 | HR 86 | Temp 98.1°F | Ht 69.0 in | Wt 283.0 lb

## 2018-04-27 DIAGNOSIS — K219 Gastro-esophageal reflux disease without esophagitis: Secondary | ICD-10-CM | POA: Diagnosis not present

## 2018-04-27 DIAGNOSIS — R739 Hyperglycemia, unspecified: Secondary | ICD-10-CM

## 2018-04-27 DIAGNOSIS — R1032 Left lower quadrant pain: Secondary | ICD-10-CM | POA: Diagnosis not present

## 2018-04-27 NOTE — Assessment & Plan Note (Signed)
stable overall by history and exam, recent data reviewed with pt, and pt to continue medical treatment as before,  to f/u any worsening symptoms or concerns Lab Results  Component Value Date   HGBA1C 6.2 12/10/2017

## 2018-04-27 NOTE — Progress Notes (Signed)
Subjective:    Patient ID: Douglas Maxwell, male    DOB: Jan 15, 1981, 37 y.o.   MRN: [id number]  HPI  Here with LLQ pain with a firm lump area under the pannus near and just caudal to the mid aspect left inguinal ligament line, with pain about 6/10 for 3 days last wk when he made the appt, and pain now resolved.  Seemed to start while crawling under house to work, and worse after to bending, but not sitting or lying down. Denies worsening reflux, abd pain, dysphagia, n/v, bowel change or blood.   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   Pt denies polydipsia, polyuria, Past Medical History:  Diagnosis Date  . GERD (gastroesophageal reflux disease)    Past Surgical History:  Procedure Laterality Date  . TONSILLECTOMY  1990    reports that he has been smoking cigarettes.  He has been smoking about 1.00 pack per day. He has never used smokeless tobacco. He reports that he does not drink alcohol or use drugs. family history includes COPD in his father; Cancer in his mother; Diabetes in his father. Allergies  Allergen Reactions  . Antihistamines, Diphenhydramine-Type Anaphylaxis   Current Outpatient Medications on File Prior to Visit  Medication Sig Dispense Refill  . cyclobenzaprine (FLEXERIL) 10 MG tablet Take 1 tablet (10 mg total) by mouth 2 (two) times daily as needed for muscle spasms. 20 tablet 0  . Diclofenac Sodium (PENNSAID) 2 % SOLN Place 2 application onto the skin 2 (two) times daily. 112 g 3  . gabapentin (NEURONTIN) 100 MG capsule Take 2 capsules (200 mg total) by mouth at bedtime. 180 capsule 1  . ibuprofen (ADVIL,MOTRIN) 600 MG tablet Take 1 tablet (600 mg total) by mouth every 6 (six) hours as needed. 60 tablet 0  .  Vitamin D, Ergocalciferol, (DRISDOL) 50000 units CAPS capsule TAKE ONE CAPSULE BY MOUTH EVERY 7 DAYS 12 capsule 0   No current facility-administered medications on file prior to visit.    Review of Systems  Constitutional: Negative for other unusual diaphoresis or sweats HENT: Negative for ear discharge or swelling Eyes: Negative for other worsening visual disturbances Respiratory: Negative for stridor or other swelling  Gastrointestinal: Negative for worsening distension or other blood Genitourinary: Negative for retention or other urinary change Musculoskeletal: Negative for other MSK pain or swelling Skin: Negative for color change or other new lesions Neurological: Negative for worsening tremors and other numbness  Psychiatric/Behavioral: Negative for worsening agitation or other fatigue All other system neg per pt    Objective:   Physical Exam BP 126/86   Pulse 86   Temp 98.1 F (36.7 C) (Oral)   Ht 5\' 9"  (1.753 m)   Wt 283 lb (128.4 kg)   SpO2 96%   BMI 41.79 kg/m  VS noted, morbid obese Constitutional: Pt appears in NAD HENT: Head: NCAT.  Right Ear: External ear normal.  Left Ear: External ear normal.  Eyes: . Pupils are equal, round, and reactive to light. Conjunctivae and EOM are normal Nose: without d/c or deformity Neck: Neck supple. Gross normal ROM Cardiovascular: Normal rate and regular rhythm.   Pulmonary/Chest: Effort normal and breath sounds without rales or wheezing.  Abd:  Soft, NT, ND, + BS, no organomegaly; no specific swelling or overt hernitation to the umbilicus, abd wall or inguinal area;  Has a nontender sub pannus vague firmness nonmobile he points to < 1 cm to the area LLQ just above the mid aspect of the left inguinal ligament Neurological: Pt is alert. At baseline orientation, motor grossly intact Skin: Skin is warm. No rashes, other new lesions, no LE edema Psychiatric: Pt behavior is normal without agitation  No other exam findings Lab  Results  Component Value Date   WBC 10.1 12/10/2017   HGB 15.3 12/10/2017   HCT 44.6 12/10/2017   PLT 314.0 12/10/2017   GLUCOSE 98 12/10/2017   CHOL 190 12/10/2017   TRIG 147.0 12/10/2017   HDL 38.60 (L) 12/10/2017   LDLCALC 122 (H) 12/10/2017   ALT 57 (H) 12/10/2017   AST 32 12/10/2017   NA 139 12/10/2017   K 3.9 12/10/2017   CL 103 12/10/2017   CREATININE 0.69 12/10/2017   BUN 13 12/10/2017   CO2 27 12/10/2017   TSH 1.74 12/10/2017   HGBA1C 6.2 12/10/2017      Assessment & Plan:

## 2018-04-27 NOTE — Assessment & Plan Note (Signed)
stable overall by history and exam, recent data reviewed with pt, and pt to continue medical treatment as before,  to f/u any worsening symptoms or concerns  

## 2018-04-27 NOTE — Assessment & Plan Note (Signed)
Resolved today, exam benign, I see no evidence for overt herniation of any kind but difficult due to obesity and cannot completely rule out; other etiology would be MSK strain with crawling under the house; ok for tylenol prn if needed, and will hold on specific lab or ct at this point as he is doing well;  He is instructed to present to ED for any worsening persistent pain, or return here for any other milder intermittent symptoms to consider further evaluation

## 2018-04-27 NOTE — Patient Instructions (Signed)
Please continue all other medications as before, and refills have been done if requested.  Please have the pharmacy call with any other refills you may need.  Please continue your efforts at being more active, low cholesterol diet, and weight control.  Please keep your appointments with your specialists as you may have planned  Please return in 6 months, or sooner if needed 

## 2018-05-10 ENCOUNTER — Other Ambulatory Visit: Payer: Self-pay | Admitting: Internal Medicine

## 2018-05-25 ENCOUNTER — Other Ambulatory Visit: Payer: Self-pay | Admitting: Family Medicine

## 2018-06-03 ENCOUNTER — Other Ambulatory Visit: Payer: Self-pay | Admitting: Family Medicine

## 2018-07-14 ENCOUNTER — Other Ambulatory Visit: Payer: Self-pay | Admitting: Internal Medicine

## 2018-08-23 ENCOUNTER — Other Ambulatory Visit: Payer: Self-pay | Admitting: Family Medicine

## 2018-09-03 ENCOUNTER — Other Ambulatory Visit: Payer: Self-pay | Admitting: Internal Medicine

## 2018-09-12 NOTE — Progress Notes (Signed)
[name] [address] [place], [place] [address] Phone: [phone] Subjective:    [name] am serving as a Neurosurgeon for Dr. Antoine Primas.   I'm seeing this patient by the request  of:    CC: ankle pain  BJY:NWGNFAOZHY  Douglas Maxwell is a 37 y.o. male coming in with complaint of ankle pain. States his ankles are feeling ok. Lateral ankle pain today. Sore with a lot of walking.  Patient is having discomfort most of the time of this point.  Still not as severe as he has had on the medial aspect of his knee in the posterior tibialis insufficiency.  Patient has not had any more of a tearing sensation.  Wearing the custom orthotics and feels like they have been beneficial overall though.      Past Medical History:  Diagnosis Date  . GERD (gastroesophageal reflux disease)    Past Surgical History:  Procedure Laterality Date  . TONSILLECTOMY  1990   Social History   Socioeconomic History  . Marital status: Single    Spouse name: Not on file  . Number of children: Not on file  . Years of education: Not on file  . Highest education level: Not on file  Occupational History  . Not on file  Social Needs  . Financial resource strain: Not on file  . Food insecurity:    Worry: Not on file    Inability: Not on file  . Transportation needs:    Medical: Not on file    Non-medical: Not on file  Tobacco Use  . Smoking status: Current Every Day Smoker    Packs/day: 1.00    Types: Cigarettes  . Smokeless tobacco: Never Used  Substance and Sexual Activity  . Alcohol use: No  . Drug use: No  . Sexual activity: Not on file  Lifestyle  . Physical activity:    Days per week: Not on file    Minutes per session: Not on file  . Stress: Not on file  Relationships  . Social connections:    Talks on phone: Not on file    Gets together: Not on file    Attends religious service: Not on file    Active member of club or organization: Not on file   Attends meetings of clubs or organizations: Not on file    Relationship status: Not on file  Other Topics Concern  . Not on file  Social History Narrative  . Not on file   Allergies  Allergen Reactions  . Antihistamines, Diphenhydramine-Type Anaphylaxis   Family History  Problem Relation Age of Onset  . Cancer Mother        lung  . Diabetes Father   . COPD Father        Current Outpatient Medications (Analgesics):  .  ibuprofen (ADVIL,MOTRIN) 600 MG tablet, Take 1 tablet (600 mg total) by mouth every 6 (six) hours as needed. Marland Kitchen  ibuprofen (ADVIL,MOTRIN) 600 MG tablet, TAKE 1 TABLET BY MOUTH EVERY 6 HOURS AS NEEDED   Current Outpatient Medications (Other):  .  cyclobenzaprine (FLEXERIL) 10 MG tablet, Take 1 tablet (10 mg total) by mouth 2 (two) times daily as needed for muscle spasms. .  Diclofenac Sodium (PENNSAID) 2 % SOLN, Place 2 application onto the skin 2 (two) times daily. Marland Kitchen  gabapentin (NEURONTIN) 100 MG capsule, TAKE 2 CAPSULES (200 MG TOTAL) BY MOUTH AT BEDTIME. Marland Kitchen  Vitamin D, Ergocalciferol, (DRISDOL) 50000 units CAPS  capsule, TAKE ONE CAPSULE BY MOUTH EVERY 7 DAYS .  Vitamin D, Ergocalciferol, (DRISDOL) 50000 units CAPS capsule, TAKE ONE CAPSULE BY MOUTH EVERY 7 DAYS    Past medical history, social, surgical and family history all reviewed in electronic medical record.  No pertanent information unless stated regarding to the chief complaint.   Review of Systems:  No headache, visual changes, nausea, vomiting, diarrhea, constipation, dizziness, abdominal pain, skin rash, fevers, chills, night sweats, weight loss, swollen lymph nodes, body aches, joint swelling, muscle aches, chest pain, shortness of breath, mood changes.   Objective  Blood pressure 130/84, pulse 79, height 5\' 9"  (1.753 m), weight 283 lb (128.4 kg), SpO2 98 %.  General: No apparent distress alert and oriented x3 mood and affect normal, dressed appropriately.  HEENT: Pupils equal, extraocular  movements intact  Respiratory: Patient's speak in full sentences and does not appear short of breath  Cardiovascular: No lower extremity edema, non tender, no erythema  Skin: Warm dry intact with no signs of infection or rash on extremities or on axial skeleton.  Abdomen: Soft nontender  Neuro: Cranial nerves II through XII are intact, neurovascularly intact in all extremities with 2+ DTRs and 2+ pulses.  Lymph: No lymphadenopathy of posterior or anterior cervical chain or axillae bilaterally.  Gait mild antalgic MSK:  Non tender with full range of motion and good stability and symmetric strength and tone of shoulders, elbows, wrist, hip, knee bilaterally.  Ankle exam bilaterally shows the patient does have overpronation of the hindfoot severely.  Posterior insufficiency still noted.  Left ankle mild pain over the peroneal tendons but no true signs of subluxation.  Limited musculoskeletal ultrasound was performed and interpreted by [name]  Limited ultrasound shows that patient does have some mild peroneal subluxation with mild hypoechoic changes.  Posterior tibialis tendon bilaterally does have some mild hypoechoic changes but no new tears appreciated.  Otherwise unremarkable. Impression: New mild peroneal subluxation   [id number]; 15 additional minutes spent for Therapeutic exercises as stated in above notes.  This included exercises focusing on stretching, strengthening, with significant focus on eccentric aspects.   Long term goals include an improvement in range of motion, strength, endurance as well as avoiding reinjury. Patient's frequency would include in 1-2 times a day, 3-5 times a week for a duration of 6-12 weeks.Ankle strengthening that included:  Basic range of motion exercises to allow proper full motion at ankle Stretching of the lower leg and hamstrings  Theraband exercises for the lower leg - inversion, eversion, dorsiflexion and plantarflexion each to be completed with a  theraband Balance exercises to increase proprioception Weight bearing exercises to increase strength and balance   Proper technique shown and discussed handout in great detail with ATC.  All questions were discussed and answered.      Impression and Recommendations:     This case required medical decision making of moderate complexity. The above documentation has been reviewed and is accurate and complete [name], DO       Note: This dictation was prepared with Dragon dictation along with smaller phrase technology. Any transcriptional errors that result from this process are unintentional.

## 2018-09-14 ENCOUNTER — Encounter: Payer: Self-pay | Admitting: Family Medicine

## 2018-09-14 ENCOUNTER — Ambulatory Visit (INDEPENDENT_AMBULATORY_CARE_PROVIDER_SITE_OTHER): Payer: BLUE CROSS/BLUE SHIELD | Admitting: Family Medicine

## 2018-09-14 ENCOUNTER — Ambulatory Visit: Payer: Self-pay

## 2018-09-14 VITALS — BP 130/84 | HR 79 | Ht 69.0 in | Wt 283.0 lb

## 2018-09-14 DIAGNOSIS — M25579 Pain in unspecified ankle and joints of unspecified foot: Secondary | ICD-10-CM

## 2018-09-14 DIAGNOSIS — IMO0001 Reserved for inherently not codable concepts without codable children: Secondary | ICD-10-CM

## 2018-09-14 DIAGNOSIS — S93332A Other subluxation of left foot, initial encounter: Secondary | ICD-10-CM | POA: Insufficient documentation

## 2018-09-14 DIAGNOSIS — S9305XA Dislocation of left ankle joint, initial encounter: Secondary | ICD-10-CM

## 2018-09-14 DIAGNOSIS — G8929 Other chronic pain: Secondary | ICD-10-CM

## 2018-09-14 NOTE — Patient Instructions (Addendum)
Good to see you  Ice is your friend Stay active Exercises 3 times a week.  [email or web address] size large x-linked ankle brace pennsaid pinkie amount topically 2 times daily as needed.   See me again in 4-6 weeks to make sure it goes away\

## 2018-09-14 NOTE — Assessment & Plan Note (Signed)
Discussed with patient about bracing, heel lift, which activities to do which was to avoid.  Discussed proper shoes.  Which wants to do which wants to avoid.  Follow-up again 4 weeks

## 2018-10-22 ENCOUNTER — Encounter: Payer: Self-pay | Admitting: Family Medicine

## 2018-10-22 ENCOUNTER — Ambulatory Visit: Payer: Self-pay

## 2018-10-22 ENCOUNTER — Ambulatory Visit (INDEPENDENT_AMBULATORY_CARE_PROVIDER_SITE_OTHER): Payer: BLUE CROSS/BLUE SHIELD | Admitting: Family Medicine

## 2018-10-22 VITALS — BP 118/90 | HR 83 | Ht 69.0 in | Wt 286.0 lb

## 2018-10-22 DIAGNOSIS — M216X2 Other acquired deformities of left foot: Secondary | ICD-10-CM | POA: Diagnosis not present

## 2018-10-22 DIAGNOSIS — I809 Phlebitis and thrombophlebitis of unspecified site: Secondary | ICD-10-CM

## 2018-10-22 DIAGNOSIS — M25579 Pain in unspecified ankle and joints of unspecified foot: Secondary | ICD-10-CM

## 2018-10-22 DIAGNOSIS — IMO0001 Reserved for inherently not codable concepts without codable children: Secondary | ICD-10-CM

## 2018-10-22 DIAGNOSIS — S9305XA Dislocation of left ankle joint, initial encounter: Secondary | ICD-10-CM

## 2018-10-22 MED ORDER — DOXYCYCLINE HYCLATE 100 MG PO TABS: 100.0000 mg | ORAL_TABLET | Freq: Two times a day (BID) | ORAL | 0 refills | Status: AC

## 2018-10-22 NOTE — Progress Notes (Signed)
[name] [address] [place], [place] [address] Phone: [phone] Subjective:    [name] am serving as a Neurosurgeon for Dr. Antoine Primas.    CC: Ankle pain follow-up  BJY:NWGNFAOZHY  Douglas Maxwell is a 37 y.o. male coming in with complaint of ankle pain. Ankle is not feeling good. Past 3 weeks have been painful.  Patient has had pain in the ankle.  Patient states that is more anterior is been walking more.      Past Medical History:  Diagnosis Date  . GERD (gastroesophageal reflux disease)    Past Surgical History:  Procedure Laterality Date  . TONSILLECTOMY  1990   Social History   Socioeconomic History  . Marital status: Single    Spouse name: Not on file  . Number of children: Not on file  . Years of education: Not on file  . Highest education level: Not on file  Occupational History  . Not on file  Social Needs  . Financial resource strain: Not on file  . Food insecurity:    Worry: Not on file    Inability: Not on file  . Transportation needs:    Medical: Not on file    Non-medical: Not on file  Tobacco Use  . Smoking status: Current Every Day Smoker    Packs/day: 1.00    Types: Cigarettes  . Smokeless tobacco: Never Used  Substance and Sexual Activity  . Alcohol use: No  . Drug use: No  . Sexual activity: Not on file  Lifestyle  . Physical activity:    Days per week: Not on file    Minutes per session: Not on file  . Stress: Not on file  Relationships  . Social connections:    Talks on phone: Not on file    Gets together: Not on file    Attends religious service: Not on file    Active member of club or organization: Not on file    Attends meetings of clubs or organizations: Not on file    Relationship status: Not on file  Other Topics Concern  . Not on file  Social History Narrative  . Not on file   Allergies  Allergen Reactions  . Antihistamines, Diphenhydramine-Type Anaphylaxis   Family History   Problem Relation Age of Onset  . Cancer Mother        lung  . Diabetes Father   . COPD Father        Current Outpatient Medications (Analgesics):  .  ibuprofen (ADVIL,MOTRIN) 600 MG tablet, Take 1 tablet (600 mg total) by mouth every 6 (six) hours as needed. Marland Kitchen  ibuprofen (ADVIL,MOTRIN) 600 MG tablet, TAKE 1 TABLET BY MOUTH EVERY 6 HOURS AS NEEDED   Current Outpatient Medications (Other):  .  cyclobenzaprine (FLEXERIL) 10 MG tablet, Take 1 tablet (10 mg total) by mouth 2 (two) times daily as needed for muscle spasms. .  Diclofenac Sodium (PENNSAID) 2 % SOLN, Place 2 application onto the skin 2 (two) times daily. Marland Kitchen  gabapentin (NEURONTIN) 100 MG capsule, TAKE 2 CAPSULES (200 MG TOTAL) BY MOUTH AT BEDTIME. Marland Kitchen  Vitamin D, Ergocalciferol, (DRISDOL) 50000 units CAPS capsule, TAKE ONE CAPSULE BY MOUTH EVERY 7 DAYS .  Vitamin D, Ergocalciferol, (DRISDOL) 50000 units CAPS capsule, TAKE ONE CAPSULE BY MOUTH EVERY 7 DAYS .  doxycycline (VIBRA-TABS) 100 MG tablet, Take 1 tablet (100 mg total) by mouth 2 (two) times daily for 7 days.  Past medical history, social, surgical and family history all reviewed in electronic medical record.  No pertanent information unless stated regarding to the chief complaint.   Review of Systems:  No headache, visual changes, nausea, vomiting, diarrhea, constipation, dizziness, abdominal pain, skin rash, fevers, chills, night sweats, weight loss, swollen lymph nodes, body aches, joint swelling,  chest pain, shortness of breath, mood changes.  Positive muscle aches  Objective  Blood pressure 118/90, pulse 83, height 5\' 9"  (1.753 m), weight 286 lb (129.7 kg), SpO2 97 %.    General: No apparent distress alert and oriented x3 mood and affect normal, dressed appropriately.  HEENT: Pupils equal, extraocular movements intact  Respiratory: Patient's speak in full sentences and does not appear short of breath  Cardiovascular: No lower extremity edema, non tender, no  erythema  Skin: Warm dry intact with no signs of infection or rash on extremities or on axial skeleton.  Abdomen: Soft nontender  Neuro: Cranial nerves II through XII are intact, neurovascularly intact in all extremities with 2+ DTRs and 2+ pulses.  Lymph: No lymphadenopathy of posterior or anterior cervical chain or axillae bilaterally.  Gait normal with good balance and coordination.  MSK:  Non tender with full range of motion and good stability and symmetric strength and tone of shoulders, elbows, wrist, hip, knee bilaterally.  Ankle exams are severe overpronation with almost subluxation of patient's ankles medially.  Patient has significant instability of the posterior tibialis tendon.  Patient does have some swelling noted over the anterior lateral aspect of the ankle seems to be more of a varicose vein.  Limited musculoskeletal ultrasound was performed and interpreted by Judi SaaZachary M   Limited ultrasound shows the patient has more of a varicose vein with possible superficial phlebitis. Impression, superficial phlebitis    Impression and Recommendations:     This case required medical decision making of moderate complexity. The above documentation has been reviewed and is accurate and complete Judi SaaZachary M , DO       Note: This dictation was prepared with Dragon dictation along with smaller phrase technology. Any transcriptional errors that result from this process are unintentional.

## 2018-10-22 NOTE — Patient Instructions (Signed)
Good to see you  Superficial phlebitis.  Running compression socks with a lot of standing Aspirin 81 mg daily for at least 2 weeks Doxycycline 2 times a day for a week  Make an appointment just in case in 6 weeks Otherwise can call [phone] when you need us!

## 2018-10-22 NOTE — Assessment & Plan Note (Signed)
Stable with the orthotics and the new shoes.  Continue to monitor.

## 2018-10-22 NOTE — Assessment & Plan Note (Signed)
Patient has what appears to be more of a superficial phlebitis.  Patient's posterior tibialis does have some mild increase in tendon sheath effusion but not as severe as we seen previously.  Peroneal tendons appear to be better.  Treated with aspirin as well as doxycycline.  Worsening symptoms patient will come back sooner.  Do not feel advanced imaging is necessary.  No calf pain on compression so not concerned for any type of deep vein thrombosis.  Follow-up again in 4 weeks

## 2018-11-01 ENCOUNTER — Other Ambulatory Visit: Payer: Self-pay | Admitting: Internal Medicine

## 2018-11-21 ENCOUNTER — Other Ambulatory Visit: Payer: Self-pay | Admitting: Family Medicine

## 2018-11-26 ENCOUNTER — Other Ambulatory Visit: Payer: Self-pay | Admitting: Family Medicine

## 2019-01-05 ENCOUNTER — Other Ambulatory Visit: Payer: Self-pay | Admitting: Internal Medicine

## 2019-05-11 ENCOUNTER — Other Ambulatory Visit: Payer: Self-pay | Admitting: Internal Medicine

## 2019-06-01 ENCOUNTER — Other Ambulatory Visit: Payer: Self-pay | Admitting: Family Medicine

## 2020-02-25 ENCOUNTER — Ambulatory Visit: Payer: BC Managed Care – PPO | Attending: Internal Medicine

## 2020-02-25 DIAGNOSIS — Z23 Encounter for immunization: Secondary | ICD-10-CM

## 2020-02-25 NOTE — Progress Notes (Signed)
   Covid-19 Vaccination Clinic  Name:  Douglas Maxwell    MRN: [id number] DOB: 1981/08/25  02/25/2020  [name] was observed post Covid-19 immunization for 15 minutes without incident. He was provided with Vaccine Information Sheet and instruction to access the V-Safe system.   [name] was instructed to call 911 with any severe reactions post vaccine: Marland Kitchen Difficulty breathing  . Swelling of face and throat  . A fast heartbeat  . A bad rash all over body  . Dizziness and weakness   Immunizations Administered    Name Date Dose VIS Date Route   Pfizer COVID-19 Vaccine 02/25/2020  4:52 PM 0.3 mL 10/28/2019 Intramuscular   Manufacturer: [employer], [employer]   Lot: [id number]   NDC: [address]-1

## 2020-03-20 ENCOUNTER — Ambulatory Visit: Payer: BC Managed Care – PPO | Attending: Internal Medicine

## 2020-03-20 DIAGNOSIS — Z23 Encounter for immunization: Secondary | ICD-10-CM

## 2020-03-20 NOTE — Progress Notes (Signed)
   Covid-19 Vaccination Clinic  Name:  FREDRICH CORY    MRN: [id number] DOB: [birth year]  03/20/2020  Mr. Tigges was observed post Covid-19 immunization for 15 minutes without incident. He was provided with Vaccine Information Sheet and instruction to access the V-Safe system.   Mr. Montour was instructed to call 911 with any severe reactions post vaccine: Marland Kitchen Difficulty breathing  . Swelling of face and throat  . A fast heartbeat  . A bad rash all over body  . Dizziness and weakness   Immunizations Administered    Name Date Dose VIS Date Route   Pfizer COVID-19 Vaccine 03/20/2020  8:44 AM 0.3 mL 01/11/2019 Intramuscular   Manufacturer: [employer], [employer]   Lot: [id number]   NDC: [address]-2

## 2020-11-28 ENCOUNTER — Other Ambulatory Visit: Payer: Self-pay

## 2020-11-28 ENCOUNTER — Ambulatory Visit (INDEPENDENT_AMBULATORY_CARE_PROVIDER_SITE_OTHER): Payer: BC Managed Care – PPO | Admitting: Internal Medicine

## 2020-11-28 ENCOUNTER — Encounter: Payer: Self-pay | Admitting: Internal Medicine

## 2020-11-28 VITALS — BP 118/80 | HR 90 | Temp 98.2°F | Wt 276.4 lb

## 2020-11-28 DIAGNOSIS — R319 Hematuria, unspecified: Secondary | ICD-10-CM

## 2020-11-28 DIAGNOSIS — N452 Orchitis: Secondary | ICD-10-CM | POA: Insufficient documentation

## 2020-11-28 DIAGNOSIS — R310 Gross hematuria: Secondary | ICD-10-CM

## 2020-11-28 DIAGNOSIS — N390 Urinary tract infection, site not specified: Secondary | ICD-10-CM

## 2020-11-28 DIAGNOSIS — R300 Dysuria: Secondary | ICD-10-CM | POA: Insufficient documentation

## 2020-11-28 LAB — URINALYSIS, ROUTINE W REFLEX MICROSCOPIC
Bilirubin Urine: NEGATIVE
Ketones, ur: NEGATIVE
Nitrite: NEGATIVE
RBC / HPF: NONE SEEN (ref 0–?)
Specific Gravity, Urine: 1.025 (ref 1.000–1.030)
Total Protein, Urine: NEGATIVE
Urine Glucose: 500 — AB
Urobilinogen, UA: 0.2 (ref 0.0–1.0)
pH: 6 (ref 5.0–8.0)

## 2020-11-28 MED ORDER — CIPROFLOXACIN HCL 500 MG PO TABS: 500.0000 mg | ORAL_TABLET | Freq: Two times a day (BID) | ORAL | 0 refills | Status: AC

## 2020-11-28 MED ORDER — CEFTRIAXONE SODIUM 1 G IJ SOLR
1.0000 g | Freq: Once | INTRAMUSCULAR | Status: AC
Start: 2020-11-28 — End: 2020-11-28
  Administered 2020-11-28: 1 g via INTRAMUSCULAR

## 2020-11-28 MED ORDER — IBUPROFEN 600 MG PO TABS
600.0000 mg | ORAL_TABLET | Freq: Four times a day (QID) | ORAL | 2 refills | Status: DC | PRN
Start: 2020-11-28 — End: 2021-01-27

## 2020-11-28 MED ORDER — LIDOCAINE HCL (PF) 1 % IJ SOLN
2.1000 mL | Freq: Once | INTRAMUSCULAR | Status: AC
Start: 2020-11-28 — End: 2020-11-28
  Administered 2020-11-28: 2.1 mL via INTRADERMAL

## 2020-11-28 NOTE — Patient Instructions (Signed)
You had the antibiotic shot today  Please take all new medication as prescribed - the home pill antibiotic, and ibuprofen for pain as needed  Please continue all other medications as before, and refills have been done if requested.  Please have the pharmacy call with any other refills you may need  Please keep your appointments with your specialists as you may have planned  Please go to the LAB at the blood drawing area for the tests to be done - just the urine testing today  Please remember to sign up for MyChart if you have not done so, as this will be important to you in the future with finding out test results, communicating by private email, and scheduling acute appointments online when needed.  You will be contacted by phone if any changes need to be made immediately.  Otherwise, you will receive a letter about your results with an explanation, but please check with MyChart first.

## 2020-11-28 NOTE — Progress Notes (Signed)
Established Patient Office Visit  Subjective:  Patient ID: Douglas Maxwell, male    DOB: Mar 28, 1981  Age: 40 y.o. MRN: [id number]  CC:  Chief Complaint  Patient presents with  . Groin Swelling    Pt states his (L) testicle is swollen. On Monday he saw blood in his urine, bt have not seen any since           HPI:  Douglas Maxwell is a 40 y.o. male here to c/o 3 days onset mild to mod gradually worsening dysuria and frequency, then onset yesterday of left testicle pain and swelling, without fever, chills, trauma; did have one episode gross hematuria x 2 days ago, none since then.  Denies urinary symptoms such as urgency, flank pain.   .        Wt Readings from Last 3 Encounters:  11/28/20 276 lb 6.4 oz (125.4 kg)  10/22/18 286 lb (129.7 kg)  09/14/18 283 lb (128.4 kg)   BP Readings from Last 3 Encounters:  11/28/20 118/80  10/22/18 118/90  09/14/18 130/84         Past Medical History:  Diagnosis Date  . GERD (gastroesophageal reflux disease)    Past Surgical History:  Procedure Laterality Date  . TONSILLECTOMY  1990    reports that he has been smoking cigarettes. He has been smoking about 1.00 pack per day. He has never used smokeless tobacco. He reports that he does not drink alcohol and does not use drugs. family history includes COPD in his father; Cancer in his mother; Diabetes in his father. Allergies  Allergen Reactions  . Antihistamines, Diphenhydramine-Type Anaphylaxis   No current outpatient medications on file prior to visit.   No current facility-administered medications on file prior to visit.        ROS:  All others reviewed and negative.  Objective        PE:  BP 118/80 (BP Location: Left Arm)   Pulse 90   Temp 98.2 F (36.8 C) (Oral)   Wt 276 lb 6.4 oz (125.4 kg)   SpO2 98%   BMI 40.82 kg/m                 Constitutional: Pt appears in NAD               HENT: Head: NCAT.                Right Ear: External ear normal.                 Left Ear:  External ear normal.                Eyes: . Pupils are equal, round, and reactive to light. Conjunctivae and EOM are normal               Nose: without d/c or deformity               Neck: Neck supple. Gross normal ROM               Cardiovascular: Normal rate and regular rhythm.                 Pulmonary/Chest: Effort normal and breath sounds without rales or wheezing.                Abd:  Soft, mild low mid abd tender,, ND, + BS, no organomegaly  GU: mild tender swelling left testicle and epididymus               Neurological: Pt is alert. At baseline orientation, motor grossly intact               Skin: Skin is warm. No rashes, no other new lesions, LE edema - none               Psychiatric: Pt behavior is normal without agitation   Assessment/Plan:  KATHY WARES is a 40 y.o. White or Caucasian [1] male with  has a past medical history of GERD (gastroesophageal reflux disease).   Assessment Plan  See problem oriented assessment and plan Labs/data reviewed for each problem:  Micro: none  Cardiac tracings I have personally interpreted today:  none  Pertinent Radiological findings (summarize): none    Health Maintenance Due  Topic Date Due  . Hepatitis C Screening  Never done  . COVID-19 Vaccine (3 - Booster for Pfizer series) 09/20/2020    There are no preventive care reminders to display for this patient.  Lab Results  Component Value Date   TSH 1.74 12/10/2017   Lab Results  Component Value Date   WBC 10.1 12/10/2017   HGB 15.3 12/10/2017   HCT 44.6 12/10/2017   MCV 86.9 12/10/2017   PLT 314.0 12/10/2017   Lab Results  Component Value Date   NA 139 12/10/2017   K 3.9 12/10/2017   CO2 27 12/10/2017   GLUCOSE 98 12/10/2017   BUN 13 12/10/2017   CREATININE 0.69 12/10/2017   BILITOT 0.5 12/10/2017   ALKPHOS 64 12/10/2017   AST 32 12/10/2017   ALT 57 (H) 12/10/2017   PROT 7.6 12/10/2017   ALBUMIN 4.4 12/10/2017   CALCIUM 9.5 12/10/2017   GFR  138.41 12/10/2017   Lab Results  Component Value Date   CHOL 190 12/10/2017   Lab Results  Component Value Date   HDL 38.60 (L) 12/10/2017   Lab Results  Component Value Date   LDLCALC 122 (H) 12/10/2017   Lab Results  Component Value Date   TRIG 147.0 12/10/2017   Lab Results  Component Value Date   CHOLHDL 5 12/10/2017   Lab Results  Component Value Date   HGBA1C 6.2 12/10/2017      Assessment & Plan:   Problem List Items Addressed This Visit      Unprioritized   Orchitis of left testicle    Mild to mod, for cipro as above,  Ibuprofen prn, to f/u any worsening symptoms or concerns      Relevant Orders   Urinalysis, Routine w reflex microscopic (Completed)   Urine Culture (Completed)   Gross hematuria    Will need urology referral if culture neg, consider abd/pelvic ct r/o stone      Relevant Orders   Urinalysis, Routine w reflex microscopic (Completed)   Urine Culture (Completed)   Dysuria    C/w prob uti - for rocephin 1 GM IM, and cipro course after urine studies      Relevant Orders   Urinalysis, Routine w reflex microscopic (Completed)   Urine Culture (Completed)    Other Visit Diagnoses    Urinary tract infection with hematuria, site unspecified    -  Primary   Relevant Medications   cefTRIAXone (ROCEPHIN) injection 1 g (Completed)   lidocaine (PF) (XYLOCAINE) 1 % injection 2.1 mL (Completed)      Meds ordered this encounter  Medications  . ciprofloxacin (CIPRO)  500 MG tablet    Sig: Take 1 tablet (500 mg total) by mouth 2 (two) times daily for 10 days.    Dispense:  20 tablet    Refill:  0  . ibuprofen (ADVIL) 600 MG tablet    Sig: Take 1 tablet (600 mg total) by mouth every 6 (six) hours as needed.    Dispense:  60 tablet    Refill:  2  . cefTRIAXone (ROCEPHIN) injection 1 g  . lidocaine (PF) (XYLOCAINE) 1 % injection 2.1 mL    Follow-up: Return if symptoms worsen or fail to improve.   Oliver Barre, MD 12/02/2020 6:14 PM [facility]

## 2020-11-30 ENCOUNTER — Encounter: Payer: Self-pay | Admitting: Internal Medicine

## 2020-11-30 LAB — URINE CULTURE

## 2020-12-02 ENCOUNTER — Encounter: Payer: Self-pay | Admitting: Internal Medicine

## 2020-12-02 NOTE — Assessment & Plan Note (Signed)
Mild to mod, for cipro as above,  Ibuprofen prn, to f/u any worsening symptoms or concerns

## 2020-12-02 NOTE — Assessment & Plan Note (Signed)
Will need urology referral if culture neg, consider abd/pelvic ct r/o stone

## 2020-12-02 NOTE — Assessment & Plan Note (Signed)
C/w prob uti - for rocephin 1 GM IM, and cipro course after urine studies

## 2021-01-27 ENCOUNTER — Telehealth: Payer: Self-pay | Admitting: Internal Medicine

## 2021-01-27 ENCOUNTER — Other Ambulatory Visit: Payer: Self-pay | Admitting: Internal Medicine

## 2021-01-27 MED ORDER — IBUPROFEN 600 MG PO TABS: 600.0000 mg | ORAL_TABLET | Freq: Four times a day (QID) | ORAL | Status: DC | PRN

## 2021-01-27 NOTE — Telephone Encounter (Signed)
Ok to let pt know I refilled the ibuprofen but we should not keep refilling for the long term due to risk of stomach ulcers

## 2021-01-28 NOTE — Telephone Encounter (Signed)
Patient notified via voicemail.

## 2021-04-01 ENCOUNTER — Other Ambulatory Visit: Payer: Self-pay | Admitting: Internal Medicine

## 2021-04-07 ENCOUNTER — Other Ambulatory Visit: Payer: Self-pay | Admitting: Internal Medicine

## 2021-05-12 ENCOUNTER — Other Ambulatory Visit: Payer: Self-pay | Admitting: Internal Medicine

## 2021-06-22 ENCOUNTER — Other Ambulatory Visit: Payer: Self-pay | Admitting: Internal Medicine

## 2021-07-25 ENCOUNTER — Other Ambulatory Visit: Payer: Self-pay | Admitting: Internal Medicine

## 2021-07-25 NOTE — Telephone Encounter (Signed)
Ok to let pt know - this medication is not really meant to be taken at this higher dose for an extended period of time due to risk of stomach ulcers  Can he do ok with tylenol? Or maybe need change to mobic 15 mg per day ?   As this would be safer

## 2023-06-12 ENCOUNTER — Telehealth: Payer: 59 | Admitting: Family Medicine

## 2023-06-12 DIAGNOSIS — I8392 Asymptomatic varicose veins of left lower extremity: Secondary | ICD-10-CM | POA: Diagnosis not present

## 2023-06-12 DIAGNOSIS — I809 Phlebitis and thrombophlebitis of unspecified site: Secondary | ICD-10-CM

## 2023-06-12 MED ORDER — CEPHALEXIN 500 MG PO CAPS: 500.0000 mg | ORAL_CAPSULE | Freq: Three times a day (TID) | ORAL | 0 refills | Status: AC

## 2023-06-12 NOTE — Patient Instructions (Signed)
Thrombophlebitis  Thrombophlebitis is a condition in which a blood clot and inflammation occur inside a vein. This can happen in the arms, legs, or torso. Thrombophlebitis may involve superficial veins, deep veins, or both. Superficial veins are close to the surface of the body and are part of the superficial venous system. Veins that are deeper inside the body are part of the deep venous system. When this condition happens in a superficial vein (superficial thrombophlebitis), it is usually not serious.However, when the condition happens in a vein that is part of the deep venous system (deep vein thrombosis, DVT), it can cause serious problems. What are the causes? This condition may be caused by: Infection, injury, or trauma to a vein. Inflammation of the veins. Medical conditions that can cause blood to clot more easily (hypercoagulable state). Backing up, or reflux, of blood flow through the veins (chronic venous insufficiency or venous stasis). What increases the risk? The following factors may make you more likely to develop this condition: Having a long, thin tube (catheter) put in a vein, such as a central line, port, or IV catheter. Getting certain medicines through a catheter that can irritate the vein. Pregnancy or having recently given birth. Cancer. Obesity. Taking oral contraceptive pills (OCPs) or hormone therapy (HT) medicines. Spasms of veins. Immobilization, or not moving the limbs for prolonged periods. What are the signs or symptoms? The main symptoms of this condition are: Swelling and pain in an arm or leg. If the affected vein is in the leg, you may feel pain while standing or walking. Warmth or redness in an arm or leg. Tenderness in the affected area when it is touched. Other symptoms include: Low-grade fever. Muscle aches. A bulging or hard vein (venous distension). In some cases, there are no symptoms. How is this diagnosed? This condition may be diagnosed  based on: Your symptoms and medical history. A physical exam. Tests, such as a test that uses sound waves to make images (duplex ultrasound). How is this treated? Treatment depends on how severe the condition is and which area of the body is affected. Treatment may include: Applying a warm compress or heating pad to affected areas. This may need to be repeated several times a day. Moving the affected limb. For example, you may need to start doing walking exercises right away. You will also be encouraged to continue your usual daily activities. Raising (elevating) the affected arm or leg above the level of your heart. Medicines, such as: Anti-inflammatory medicines, such as ibuprofen. Blood thinners (anticoagulants) or anti-platelet drugs such as aspirin. Removing an IV or central line that may be causing the problem. Wearing compression stockings to help prevent blood clots and reduce swelling in your legs. Follow these instructions at home: Medicines Take over-the-counter and prescription medicines only as told by your health care provider. If you are taking blood thinners: Talk with your health care provider before you take any medicines that contain aspirin or NSAIDs, such as ibuprofen. These medicines increase your risk for dangerous bleeding. Take your medicine exactly as told, at the same time every day. Avoid activities that could cause injury or bruising, and follow instructions about how to prevent falls. Wear a medical alert bracelet or carry a card that lists what medicines you take. Managing pain, stiffness, and swelling  If directed, apply heat to the affected area as often as told by your health care provider. Use the heat source that your health care provider recommends, such as a moist heat pack  or a heating pad. Place a towel between your skin and the heat source. Leave the heat on for 20-30 minutes. Remove the heat if your skin turns bright red. This is especially  important if you are unable to feel pain, heat, or cold. You have a greater risk of getting burned. Elevate the affected area above the level of your heart while you are sitting or lying down. Activity Return to your normal activities as told by your health care provider. Ask your health care provider what activities are safe for you. Avoid sitting for a long time without moving. Get up to take short walks every 1-2 hours. This is important to improve blood flow and breathing. Ask for help if you feel weak or unsteady. Do exercises as told by your health care provider. Rest as told by your health care provider. General instructions Drink enough fluid to keep your urine pale yellow. Wear compression stockings as told by your health care provider. Do not use any products that contain nicotine or tobacco. These products include cigarettes, chewing tobacco, and vaping devices, such as e-cigarettes. If you need help quitting, ask your health care provider. Keep all follow-up visits. This is important. Contact a health care provider if: You miss a dose of your blood thinner, if applicable. Your symptoms do not improve. You have unusual bruising. You have nausea, vomiting, or diarrhea that lasts for more than a day. Get help right away if: You are breathing fast or have chest pain. You have blood in your vomit, urine, or stool. You have severe pain in your affected arm or leg or new pain in any arm or leg. You have light-headedness, dizziness, a severe headache, or confusion. These symptoms may represent a serious problem that is an emergency. Do not wait to see if the symptoms will go away. Get medical help right away. Call your local emergency services (911 in the [place] Do not drive yourself to the hospital. Summary Thrombophlebitis is a condition in which a blood clot forms in a vein, causing inflammation and often pain. This can happen in both superficial and deep veins. The main symptom of  this condition is swelling and pain around the affected vein. Tenderness and redness may also be present. Treatment may include warm compresses, compression stockings, anti-inflammatory medicines, or blood thinners. Make sure you take all medicines, especially blood thinners, as instructed and keep all follow-up visits to ensure proper healing of the vein. This information is not intended to replace advice given to you by your health care provider. Make sure you discuss any questions you have with your health care provider. Document Revised: 04/29/2021 Document Reviewed: 04/29/2021 Elsevier Patient Education  2024 [employer].

## 2023-06-12 NOTE — Progress Notes (Signed)
Virtual Visit Consent   Douglas Maxwell, you are scheduled for a virtual visit with a [facility] provider today. Just as with appointments in the office, your consent must be obtained to participate. Your consent will be active for this visit and any virtual visit you may have with one of our providers in the next 365 days. If you have a MyChart account, a copy of this consent can be sent to you electronically.  As this is a virtual visit, video technology does not allow for your provider to perform a traditional examination. This may limit your provider's ability to fully assess your condition. If your provider identifies any concerns that need to be evaluated in person or the need to arrange testing (such as labs, EKG, etc.), we will make arrangements to do so. Although advances in technology are sophisticated, we cannot ensure that it will always work on either your end or our end. If the connection with a video visit is poor, the visit may have to be switched to a telephone visit. With either a video or telephone visit, we are not always able to ensure that we have a secure connection.  By engaging in this virtual visit, you consent to the provision of healthcare and authorize for your insurance to be billed (if applicable) for the services provided during this visit. Depending on your insurance coverage, you may receive a charge related to this service.  I need to obtain your verbal consent now. Are you willing to proceed with your visit today? Douglas Maxwell has provided verbal consent on 06/12/2023 for a virtual visit (video or telephone). Georgana Curio, FNP  Date: 06/12/2023 11:58 AM  Virtual Visit via Video Note   I, Georgana Curio, connected with  Douglas Maxwell  ([id number], 1981/08/03) on 06/12/23 at 12:00 PM EDT by a video-enabled telemedicine application and verified that I am speaking with the correct person using two identifiers.  Location: Patient: Virtual Visit Location Patient:  Home Provider: Virtual Visit Location Provider: Home Office   I discussed the limitations of evaluation and management by telemedicine and the availability of in person appointments. The patient expressed understanding and agreed to proceed.    History of Present Illness: Douglas Maxwell is a 42 y.o. 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 No calf tenderness. He says he has been told to wear compression tockings and hasn't started yet.   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   Neck muscle spasm 02/20/2017   Abnormality of gait 12/18/2016   Rupture of posterior tibialis tendon, right, initial encounter 11/26/2016   Meralgia paresthetica, left 09/30/2016   Left tibialis posterior tendinitis 05/15/2016   Pronation deformity of ankle, 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  Resting comfortably  at home.  Head is normocephalic, atraumatic.  No labored breathing.  Speech is clear and coherent with logical content.  Patient is alert and oriented at baseline.  Rope like varicosities noted left  lower thigh near knee with redness.   Assessment and Plan: 1. Superficial phlebitis  Warm compresses, elevate, compression stockings in the future, UC if sx persist or worsen.   Follow Up Instructions: I discussed the assessment and treatment plan with the patient. The patient was provided an opportunity to ask questions and all were answered. The patient agreed with the plan and demonstrated an understanding of the instructions.  A copy of instructions were sent to the patient via MyChart unless otherwise noted below.     The patient was advised to call back or seek an in-person evaluation if the symptoms worsen or if the condition fails to improve as anticipated.  Time:  I spent 10 minutes with the patient via telehealth technology discussing the above problems/concerns.    Georgana Curio, FNP

## 2024-01-09 ENCOUNTER — Other Ambulatory Visit: Payer: Self-pay

## 2024-01-09 ENCOUNTER — Observation Stay (HOSPITAL_COMMUNITY)
Admission: EM | Admit: 2024-01-09 | Discharge: 2024-01-10 | Disposition: A | Discharge status: Home / Self Care | Payer: No Typology Code available for payment source

## 2024-01-09 ENCOUNTER — Encounter (HOSPITAL_COMMUNITY): Payer: Self-pay | Admitting: *Deleted

## 2024-01-09 DIAGNOSIS — K3580 Unspecified acute appendicitis: Secondary | ICD-10-CM | POA: Diagnosis present

## 2024-01-09 DIAGNOSIS — F17210 Nicotine dependence, cigarettes, uncomplicated: Secondary | ICD-10-CM | POA: Diagnosis not present

## 2024-01-09 DIAGNOSIS — R1031 Right lower quadrant pain: Secondary | ICD-10-CM | POA: Diagnosis present

## 2024-01-09 DIAGNOSIS — K353 Acute appendicitis with localized peritonitis, without perforation or gangrene: Secondary | ICD-10-CM | POA: Diagnosis not present

## 2024-01-09 HISTORY — DX: Other complications of anesthesia, initial encounter: T88.59XA

## 2024-01-09 LAB — CBC
HCT: 47.9 % (ref 39.0–52.0)
Hemoglobin: 15.9 g/dL (ref 13.0–17.0)
MCH: 29.2 pg (ref 26.0–34.0)
MCHC: 33.2 g/dL (ref 30.0–36.0)
MCV: 88.1 fL (ref 80.0–100.0)
Platelets: 282 10*3/uL (ref 150–400)
RBC: 5.44 MIL/uL (ref 4.22–5.81)
RDW: 13.6 % (ref 11.5–15.5)
WBC: 14.9 10*3/uL — ABNORMAL HIGH (ref 4.0–10.5)
nRBC: 0 % (ref 0.0–0.2)

## 2024-01-09 LAB — COMPREHENSIVE METABOLIC PANEL
ALT: 32 U/L (ref 0–44)
AST: 22 U/L (ref 15–41)
Albumin: 4.2 g/dL (ref 3.5–5.0)
Alkaline Phosphatase: 49 U/L (ref 38–126)
Anion gap: 10 (ref 5–15)
BUN: 10 mg/dL (ref 6–20)
CO2: 25 mmol/L (ref 22–32)
Calcium: 8.9 mg/dL (ref 8.9–10.3)
Chloride: 101 mmol/L (ref 98–111)
Creatinine, Ser: 0.6 mg/dL — ABNORMAL LOW (ref 0.61–1.24)
GFR, Estimated: >60 mL/min (ref >60)
Glucose, Bld: 137 mg/dL — ABNORMAL HIGH (ref 70–99)
Potassium: 4.2 mmol/L (ref 3.5–5.1)
Sodium: 136 mmol/L (ref 135–145)
Total Bilirubin: 0.7 mg/dL (ref 0.0–1.2)
Total Protein: 7.3 g/dL (ref 6.5–8.1)

## 2024-01-09 LAB — LIPASE, BLOOD: Lipase: 25 U/L (ref 11–51)

## 2024-01-09 MED ORDER — OXYCODONE-ACETAMINOPHEN 5-325 MG PO TABS
1.0000 | ORAL_TABLET | Freq: Once | ORAL | Status: AC
  Administered 2024-01-09: 1 via ORAL
  Filled 2024-01-09: qty 1

## 2024-01-09 MED ORDER — ONDANSETRON 4 MG PO TBDP
4.0000 mg | ORAL_TABLET | Freq: Once | ORAL | Status: AC | PRN
  Administered 2024-01-09: 4 mg via ORAL
  Filled 2024-01-09: qty 1

## 2024-01-09 NOTE — ED Triage Notes (Signed)
 The pt is c/o abd pain for 2-3 days worse today no n v or diarrhea

## 2024-01-10 ENCOUNTER — Encounter (HOSPITAL_COMMUNITY)
Admission: EM | Disposition: A | Discharge status: Home / Self Care | Payer: Self-pay | Source: Home / Self Care | Attending: Emergency Medicine

## 2024-01-10 ENCOUNTER — Other Ambulatory Visit: Payer: Self-pay

## 2024-01-10 ENCOUNTER — Inpatient Hospital Stay (HOSPITAL_COMMUNITY): Payer: No Typology Code available for payment source | Admitting: Certified Registered Nurse Anesthetist

## 2024-01-10 ENCOUNTER — Emergency Department (HOSPITAL_COMMUNITY): Payer: No Typology Code available for payment source

## 2024-01-10 ENCOUNTER — Encounter (HOSPITAL_COMMUNITY): Payer: Self-pay

## 2024-01-10 DIAGNOSIS — K3580 Unspecified acute appendicitis: Secondary | ICD-10-CM | POA: Diagnosis not present

## 2024-01-10 HISTORY — PX: LAPAROSCOPIC APPENDECTOMY: SHX408

## 2024-01-10 LAB — URINALYSIS, ROUTINE W REFLEX MICROSCOPIC
Bacteria, UA: NONE SEEN
Bilirubin Urine: NEGATIVE
Glucose, UA: NEGATIVE mg/dL
Ketones, ur: 80 mg/dL — AB
Leukocytes,Ua: NEGATIVE
Nitrite: NEGATIVE
Protein, ur: 30 mg/dL — AB
Specific Gravity, Urine: 1.02 (ref 1.005–1.030)
pH: 5 (ref 5.0–8.0)

## 2024-01-10 LAB — HIV ANTIBODY (ROUTINE TESTING W REFLEX): HIV Screen 4th Generation wRfx: NONREACTIVE

## 2024-01-10 LAB — BASIC METABOLIC PANEL
Anion gap: 11 (ref 5–15)
BUN: 5 mg/dL — ABNORMAL LOW (ref 6–20)
CO2: 22 mmol/L (ref 22–32)
Calcium: 8.9 mg/dL (ref 8.9–10.3)
Chloride: 103 mmol/L (ref 98–111)
Creatinine, Ser: 0.71 mg/dL (ref 0.61–1.24)
GFR, Estimated: >60 mL/min (ref >60)
Glucose, Bld: 153 mg/dL — ABNORMAL HIGH (ref 70–99)
Potassium: 3.7 mmol/L (ref 3.5–5.1)
Sodium: 136 mmol/L (ref 135–145)

## 2024-01-10 SURGERY — APPENDECTOMY, LAPAROSCOPIC
Anesthesia: General

## 2024-01-10 MED ORDER — HYDROMORPHONE HCL 1 MG/ML IJ SOLN: 1.0000 mg | INTRAMUSCULAR | Status: DC | PRN

## 2024-01-10 MED ORDER — CHLORHEXIDINE GLUCONATE 0.12 % MT SOLN: 15.0000 mL | Freq: Once | OROMUCOSAL | Status: AC

## 2024-01-10 MED ORDER — PROPOFOL 10 MG/ML IV BOLUS
INTRAVENOUS | Status: AC
  Filled 2024-01-10: qty 20

## 2024-01-10 MED ORDER — DEXAMETHASONE SODIUM PHOSPHATE 10 MG/ML IJ SOLN
INTRAMUSCULAR | Status: AC
  Filled 2024-01-10: qty 1

## 2024-01-10 MED ORDER — SUCCINYLCHOLINE CHLORIDE 200 MG/10ML IV SOSY
PREFILLED_SYRINGE | INTRAVENOUS | Status: AC
  Filled 2024-01-10: qty 10

## 2024-01-10 MED ORDER — PHENYLEPHRINE HCL-NACL 20-0.9 MG/250ML-% IV SOLN
INTRAVENOUS | Status: AC
  Filled 2024-01-10: qty 250

## 2024-01-10 MED ORDER — FENTANYL CITRATE (PF) 250 MCG/5ML IJ SOLN
INTRAMUSCULAR | Status: AC
  Filled 2024-01-10: qty 5

## 2024-01-10 MED ORDER — MEPERIDINE HCL 25 MG/ML IJ SOLN: 6.2500 mg | INTRAMUSCULAR | Status: DC | PRN

## 2024-01-10 MED ORDER — METRONIDAZOLE 500 MG/100ML IV SOLN
500.0000 mg | Freq: Two times a day (BID) | INTRAVENOUS | Status: DC
  Administered 2024-01-10: 500 mg via INTRAVENOUS
  Filled 2024-01-10: qty 100

## 2024-01-10 MED ORDER — DEXMEDETOMIDINE HCL IN NACL 80 MCG/20ML IV SOLN
INTRAVENOUS | Status: DC | PRN
  Administered 2024-01-10: 12 ug via INTRAVENOUS
  Administered 2024-01-10: 8 ug via INTRAVENOUS

## 2024-01-10 MED ORDER — SUCCINYLCHOLINE CHLORIDE 200 MG/10ML IV SOSY
PREFILLED_SYRINGE | INTRAVENOUS | Status: DC | PRN
  Administered 2024-01-10: 160 mg via INTRAVENOUS

## 2024-01-10 MED ORDER — AMOXICILLIN-POT CLAVULANATE 875-125 MG PO TABS: 1.0000 | ORAL_TABLET | Freq: Two times a day (BID) | ORAL | 0 refills | Status: AC

## 2024-01-10 MED ORDER — HYDROMORPHONE HCL 1 MG/ML IJ SOLN: 0.2500 mg | INTRAMUSCULAR | Status: DC | PRN

## 2024-01-10 MED ORDER — ONDANSETRON HCL 4 MG/2ML IJ SOLN
INTRAMUSCULAR | Status: AC
  Filled 2024-01-10: qty 2

## 2024-01-10 MED ORDER — LACTATED RINGERS IV SOLN: INTRAVENOUS | Status: DC | PRN

## 2024-01-10 MED ORDER — ROCURONIUM BROMIDE 10 MG/ML (PF) SYRINGE
PREFILLED_SYRINGE | INTRAVENOUS | Status: AC
  Filled 2024-01-10: qty 10

## 2024-01-10 MED ORDER — SUGAMMADEX SODIUM 200 MG/2ML IV SOLN
INTRAVENOUS | Status: DC | PRN
Start: 2024-01-10 — End: 2024-01-10
  Administered 2024-01-10: 100 mg via INTRAVENOUS
  Administered 2024-01-10: 30 mg via INTRAVENOUS
  Administered 2024-01-10: 50 mg via INTRAVENOUS
  Administered 2024-01-10: 20 mg via INTRAVENOUS

## 2024-01-10 MED ORDER — DEXAMETHASONE SODIUM PHOSPHATE 10 MG/ML IJ SOLN
INTRAMUSCULAR | Status: DC | PRN
  Administered 2024-01-10: 10 mg via INTRAVENOUS

## 2024-01-10 MED ORDER — OXYCODONE HCL 5 MG PO TABS: 10.0000 mg | ORAL_TABLET | ORAL | Status: DC | PRN

## 2024-01-10 MED ORDER — SUGAMMADEX SODIUM 200 MG/2ML IV SOLN
INTRAVENOUS | Status: AC
  Filled 2024-01-10: qty 2

## 2024-01-10 MED ORDER — SODIUM CHLORIDE 0.9 % IV SOLN
2.0000 g | Freq: Every day | INTRAVENOUS | Status: DC
  Administered 2024-01-10 (×2): 2 g via INTRAVENOUS
  Filled 2024-01-10: qty 20

## 2024-01-10 MED ORDER — METHOCARBAMOL 1000 MG/10ML IJ SOLN: 500.0000 mg | Freq: Four times a day (QID) | INTRAMUSCULAR | Status: DC | PRN

## 2024-01-10 MED ORDER — ONDANSETRON HCL 4 MG/2ML IJ SOLN
INTRAMUSCULAR | Status: DC | PRN
  Administered 2024-01-10: 4 mg via INTRAVENOUS

## 2024-01-10 MED ORDER — MIDAZOLAM HCL 2 MG/2ML IJ SOLN
INTRAMUSCULAR | Status: AC
  Filled 2024-01-10: qty 2

## 2024-01-10 MED ORDER — OXYCODONE HCL 5 MG PO TABS: 5.0000 mg | ORAL_TABLET | ORAL | Status: DC | PRN

## 2024-01-10 MED ORDER — LIDOCAINE 2% (20 MG/ML) 5 ML SYRINGE
INTRAMUSCULAR | Status: DC | PRN
  Administered 2024-01-10: 30 mg via INTRAVENOUS

## 2024-01-10 MED ORDER — ACETAMINOPHEN 10 MG/ML IV SOLN
INTRAVENOUS | Status: AC
  Filled 2024-01-10: qty 100

## 2024-01-10 MED ORDER — DOCUSATE SODIUM 100 MG PO CAPS: 100.0000 mg | ORAL_CAPSULE | Freq: Two times a day (BID) | ORAL | Status: DC

## 2024-01-10 MED ORDER — FENTANYL CITRATE (PF) 250 MCG/5ML IJ SOLN
INTRAMUSCULAR | Status: DC | PRN
  Administered 2024-01-10: 100 ug via INTRAVENOUS
  Administered 2024-01-10 (×2): 50 ug via INTRAVENOUS

## 2024-01-10 MED ORDER — PHENYLEPHRINE 80 MCG/ML (10ML) SYRINGE FOR IV PUSH (FOR BLOOD PRESSURE SUPPORT)
PREFILLED_SYRINGE | INTRAVENOUS | Status: DC | PRN
  Administered 2024-01-10 (×2): 160 ug via INTRAVENOUS

## 2024-01-10 MED ORDER — OXYCODONE HCL 5 MG PO TABS: 5.0000 mg | ORAL_TABLET | Freq: Four times a day (QID) | ORAL | 0 refills | Status: DC | PRN

## 2024-01-10 MED ORDER — SODIUM CHLORIDE 0.9 % IV SOLN
INTRAVENOUS | Status: AC
  Filled 2024-01-10: qty 20

## 2024-01-10 MED ORDER — KETOROLAC TROMETHAMINE 15 MG/ML IJ SOLN
15.0000 mg | Freq: Three times a day (TID) | INTRAMUSCULAR | Status: DC
  Administered 2024-01-10: 15 mg via INTRAVENOUS
  Filled 2024-01-10: qty 1

## 2024-01-10 MED ORDER — GLYCOPYRROLATE PF 0.2 MG/ML IJ SOSY
PREFILLED_SYRINGE | INTRAMUSCULAR | Status: AC
  Filled 2024-01-10: qty 1

## 2024-01-10 MED ORDER — GABAPENTIN 300 MG PO CAPS: 300.0000 mg | ORAL_CAPSULE | Freq: Three times a day (TID) | ORAL | Status: DC

## 2024-01-10 MED ORDER — LACTATED RINGERS IV SOLN: INTRAVENOUS | Status: DC

## 2024-01-10 MED ORDER — OXYCODONE HCL 5 MG/5ML PO SOLN: 5.0000 mg | Freq: Once | ORAL | Status: DC | PRN

## 2024-01-10 MED ORDER — DEXMEDETOMIDINE HCL IN NACL 80 MCG/20ML IV SOLN
INTRAVENOUS | Status: AC
  Filled 2024-01-10: qty 20

## 2024-01-10 MED ORDER — ENOXAPARIN SODIUM 40 MG/0.4ML IJ SOSY: 40.0000 mg | PREFILLED_SYRINGE | Freq: Every day | INTRAMUSCULAR | Status: DC

## 2024-01-10 MED ORDER — ROCURONIUM BROMIDE 10 MG/ML (PF) SYRINGE
PREFILLED_SYRINGE | INTRAVENOUS | Status: DC | PRN
  Administered 2024-01-10: 70 mg via INTRAVENOUS

## 2024-01-10 MED ORDER — LACTATED RINGERS IV BOLUS
1000.0000 mL | Freq: Once | INTRAVENOUS | Status: AC
  Administered 2024-01-10: 1000 mL via INTRAVENOUS

## 2024-01-10 MED ORDER — MIDAZOLAM HCL 2 MG/2ML IJ SOLN
INTRAMUSCULAR | Status: DC | PRN
  Administered 2024-01-10: 1 mg via INTRAVENOUS

## 2024-01-10 MED ORDER — IOHEXOL 350 MG/ML SOLN
75.0000 mL | Freq: Once | INTRAVENOUS | Status: AC | PRN
  Administered 2024-01-10: 75 mL via INTRAVENOUS

## 2024-01-10 MED ORDER — BUPIVACAINE-EPINEPHRINE (PF) 0.25% -1:200000 IJ SOLN
INTRAMUSCULAR | Status: AC
  Filled 2024-01-10: qty 30

## 2024-01-10 MED ORDER — LIDOCAINE 2% (20 MG/ML) 5 ML SYRINGE
INTRAMUSCULAR | Status: AC
  Filled 2024-01-10: qty 5

## 2024-01-10 MED ORDER — ACETAMINOPHEN 325 MG PO TABS
650.0000 mg | ORAL_TABLET | Freq: Four times a day (QID) | ORAL | Status: DC
  Administered 2024-01-10: 650 mg via ORAL
  Filled 2024-01-10: qty 2

## 2024-01-10 MED ORDER — SODIUM CHLORIDE 0.9 % IR SOLN
Status: DC | PRN
  Administered 2024-01-10: 700 mL

## 2024-01-10 MED ORDER — ORAL CARE MOUTH RINSE: 15.0000 mL | Freq: Once | OROMUCOSAL | Status: AC

## 2024-01-10 MED ORDER — MIDAZOLAM HCL 2 MG/2ML IJ SOLN: 0.5000 mg | Freq: Once | INTRAMUSCULAR | Status: DC | PRN

## 2024-01-10 MED ORDER — ACETAMINOPHEN 10 MG/ML IV SOLN
INTRAVENOUS | Status: DC | PRN
  Administered 2024-01-10: 1000 mg via INTRAVENOUS

## 2024-01-10 MED ORDER — PROCHLORPERAZINE EDISYLATE 10 MG/2ML IJ SOLN: 10.0000 mg | INTRAMUSCULAR | Status: DC | PRN

## 2024-01-10 MED ORDER — OXYCODONE HCL 5 MG PO TABS: 5.0000 mg | ORAL_TABLET | Freq: Once | ORAL | Status: DC | PRN

## 2024-01-10 MED ORDER — 0.9 % SODIUM CHLORIDE (POUR BTL) OPTIME
TOPICAL | Status: DC | PRN
  Administered 2024-01-10: 1000 mL

## 2024-01-10 MED ORDER — SIMETHICONE 80 MG PO CHEW: 80.0000 mg | CHEWABLE_TABLET | Freq: Four times a day (QID) | ORAL | Status: DC | PRN

## 2024-01-10 MED ORDER — BUPIVACAINE-EPINEPHRINE 0.25% -1:200000 IJ SOLN
INTRAMUSCULAR | Status: DC | PRN
  Administered 2024-01-10: 23 mL

## 2024-01-10 MED ORDER — PROPOFOL 10 MG/ML IV BOLUS
INTRAVENOUS | Status: DC | PRN
  Administered 2024-01-10: 160 mg via INTRAVENOUS

## 2024-01-10 MED ORDER — PHENYLEPHRINE 80 MCG/ML (10ML) SYRINGE FOR IV PUSH (FOR BLOOD PRESSURE SUPPORT)
PREFILLED_SYRINGE | INTRAVENOUS | Status: AC
  Filled 2024-01-10: qty 10

## 2024-01-10 MED ORDER — CHLORHEXIDINE GLUCONATE 0.12 % MT SOLN
OROMUCOSAL | Status: AC
  Administered 2024-01-10: 15 mL via OROMUCOSAL
  Filled 2024-01-10: qty 15

## 2024-01-10 MED ORDER — HYDROMORPHONE HCL 1 MG/ML IJ SOLN
0.5000 mg | Freq: Once | INTRAMUSCULAR | Status: AC
  Administered 2024-01-10: 0.5 mg via INTRAVENOUS
  Filled 2024-01-10: qty 1

## 2024-01-10 MED ORDER — ONDANSETRON HCL 4 MG/2ML IJ SOLN: 4.0000 mg | Freq: Four times a day (QID) | INTRAMUSCULAR | Status: DC | PRN

## 2024-01-10 SURGICAL SUPPLY — 33 items
BAG COUNTER SPONGE SURGICOUNT (BAG) ×1 IMPLANT
BLADE CLIPPER SURG (BLADE) IMPLANT
CHLORAPREP W/TINT 26 (MISCELLANEOUS) ×1 IMPLANT
COVER SURGICAL LIGHT HANDLE (MISCELLANEOUS) ×1 IMPLANT
CUTTER FLEX LINEAR 45M (STAPLE) ×1 IMPLANT
DERMABOND ADVANCED .7 DNX12 (GAUZE/BANDAGES/DRESSINGS) ×1 IMPLANT
GLOVE BIO SURGEON STRL SZ8 (GLOVE) ×1 IMPLANT
GLOVE BIOGEL PI IND STRL 8 (GLOVE) ×1 IMPLANT
GOWN STRL REUS W/ TWL LRG LVL3 (GOWN DISPOSABLE) ×2 IMPLANT
GOWN STRL REUS W/ TWL XL LVL3 (GOWN DISPOSABLE) ×1 IMPLANT
IRRIG SUCT STRYKERFLOW 2 WTIP (MISCELLANEOUS) ×1 IMPLANT
IRRIGATION SUCT STRKRFLW 2 WTP (MISCELLANEOUS) ×1 IMPLANT
KIT BASIN OR (CUSTOM PROCEDURE TRAY) ×1 IMPLANT
KIT TURNOVER KIT B (KITS) ×1 IMPLANT
NDL 22X1.5 STRL (OR ONLY) (MISCELLANEOUS) ×1 IMPLANT
NEEDLE 22X1.5 STRL (OR ONLY) (MISCELLANEOUS) ×1 IMPLANT
NS IRRIG 1000ML POUR BTL (IV SOLUTION) ×1 IMPLANT
PAD ARMBOARD 7.5X6 YLW CONV (MISCELLANEOUS) ×1 IMPLANT
POUCH RETRIEVAL ECOSAC 10 (ENDOMECHANICALS) ×1 IMPLANT
RELOAD 45 VASCULAR/THIN (ENDOMECHANICALS) ×1 IMPLANT
RELOAD STAPLE 45 2.5 WHT GRN (ENDOMECHANICALS) IMPLANT
SET TUBE SMOKE EVAC HIGH FLOW (TUBING) ×1 IMPLANT
SHEARS HARMONIC ACE PLUS 36CM (ENDOMECHANICALS) ×1 IMPLANT
SPECIMEN JAR SMALL (MISCELLANEOUS) ×1 IMPLANT
SUT VIC AB 4-0 PS2 27 (SUTURE) ×1 IMPLANT
SUT VICRYL 0 UR6 27IN ABS (SUTURE) IMPLANT
TOWEL GREEN STERILE (TOWEL DISPOSABLE) ×1 IMPLANT
TRAY LAPAROSCOPIC MC (CUSTOM PROCEDURE TRAY) ×1 IMPLANT
TROCAR BALLN 12MMX100 BLUNT (TROCAR) ×1 IMPLANT
TROCAR Z THREAD OPTICAL 12X100 (TROCAR) ×1 IMPLANT
TROCAR Z-THREAD OPTICAL 5X100M (TROCAR) ×1 IMPLANT
WARMER LAPAROSCOPE (MISCELLANEOUS) ×1 IMPLANT
WATER STERILE IRR 1000ML POUR (IV SOLUTION) ×1 IMPLANT

## 2024-01-10 NOTE — Discharge Instructions (Signed)

## 2024-01-10 NOTE — Anesthesia Preprocedure Evaluation (Addendum)
 Anesthesia Evaluation  Patient identified by MRN, date of birth, ID band Patient awake    Reviewed: Allergy & Precautions, NPO status , Patient's Chart, lab work & pertinent test results  History of Anesthesia Complications Negative for: history of anesthetic complications  Airway Mallampati: II  TM Distance: >3 FB Neck ROM: Full    Dental  (+) Poor Dentition, Chipped, Missing, Dental Advisory Given   Pulmonary Current Smoker and Patient abstained from smoking.   breath sounds clear to auscultation       Cardiovascular negative cardio ROS  Rhythm:Regular Rate:Normal     Neuro/Psych negative neurological ROS     GI/Hepatic Neg liver ROS,GERD  Controlled,,Acute appy: abd pain   Endo/Other  BMI 42  Renal/GU negative Renal ROS     Musculoskeletal   Abdominal   Peds  Hematology negative hematology ROS (+)   Anesthesia Other Findings   Reproductive/Obstetrics                             Anesthesia Physical Anesthesia Plan  ASA: 3  Anesthesia Plan: General   Post-op Pain Management: Ofirmev IV (intra-op)*   Induction: Intravenous and Rapid sequence  PONV Risk Score and Plan: 1 and Ondansetron and Dexamethasone  Airway Management Planned: Oral ETT  Additional Equipment:   Intra-op Plan:   Post-operative Plan: Extubation in OR  Informed Consent: I have reviewed the patients History and Physical, chart, labs and discussed the procedure including the risks, benefits and alternatives for the proposed anesthesia with the patient or authorized representative who has indicated his/her understanding and acceptance.     Dental advisory given  Plan Discussed with: CRNA and Surgeon  Anesthesia Plan Comments:        Anesthesia Quick Evaluation

## 2024-01-10 NOTE — Transfer of Care (Signed)
 Immediate Anesthesia Transfer of Care Note  Patient: Douglas Maxwell  Procedure(s) Performed: APPENDECTOMY LAPAROSCOPIC  Patient Location: PACU  Anesthesia Type:General  Level of Consciousness: drowsy and patient cooperative  Airway & Oxygen Therapy: Patient Spontanous Breathing and Patient connected to face mask oxygen  Post-op Assessment: Report given to RN and Post -op Vital signs reviewed and stable  Post vital signs: Reviewed and stable  Last Vitals:  Vitals Value Taken Time  BP 96/58 01/10/24 1107  Temp 36.7 C 01/10/24 1104  Pulse 75 01/10/24 1110  Resp 26 01/10/24 1110  SpO2 93 % 01/10/24 1110  Vitals shown include unfiled device data.  Last Pain:  Vitals:   01/10/24 0758  TempSrc: Oral  PainSc: 6          Complications: No notable events documented.

## 2024-01-10 NOTE — Anesthesia Procedure Notes (Signed)
 Procedure Name: Intubation Date/Time: 01/10/2024 10:04 AM  Performed by: Chelsea Aus, CRNAPre-anesthesia Checklist: Patient identified, Emergency Drugs available, Suction available and Patient being monitored Patient Re-evaluated:Patient Re-evaluated prior to induction Oxygen Delivery Method: Circle system utilized Preoxygenation: Pre-oxygenation with 100% oxygen Induction Type: IV induction and Rapid sequence Laryngoscope Size: Mac and 4 Grade View: Grade I Tube type: Oral Tube size: 7.0 mm Number of attempts: 1 Intubation method: none. Placement Confirmation: ETT inserted through vocal cords under direct vision, positive ETCO2 and breath sounds checked- equal and bilateral Secured at: 23 cm Tube secured with: Tape Dental Injury: Teeth and Oropharynx as per pre-operative assessment

## 2024-01-10 NOTE — Anesthesia Postprocedure Evaluation (Signed)
 Anesthesia Post Note  Patient: Douglas Maxwell  Procedure(s) Performed: APPENDECTOMY LAPAROSCOPIC     Patient location during evaluation: PACU Anesthesia Type: General Level of consciousness: awake and alert, patient cooperative and oriented Pain management: pain level controlled Vital Signs Assessment: post-procedure vital signs reviewed and stable Respiratory status: spontaneous breathing, nonlabored ventilation and respiratory function stable Cardiovascular status: blood pressure returned to baseline and stable Postop Assessment: no apparent nausea or vomiting Anesthetic complications: no   No notable events documented.  Last Vitals:  Vitals:   01/10/24 1130 01/10/24 1145  BP: 100/65 105/65  Pulse: 72 78  Resp: 19 (!) 23  Temp:  36.6 C  SpO2: 92% 94%    Last Pain:  Vitals:   01/10/24 1145  TempSrc:   PainSc: 0-No pain                 Zaleigh Bermingham,E. Lindley Stachnik

## 2024-01-10 NOTE — ED Provider Notes (Signed)
 [facility]GENCY DEPT [facility] Emergency Department Provider Note MRN:  [id number]  Arrival date & time: 01/10/24     Chief Complaint   Abdominal Pain   History of Present Illness   Douglas Maxwell is a 43 y.o. year-old male presents to the ED with chief complaint of abdominal pain for the past couple of days.  Acutely worsened today.  States that he has really severe cramps.  States that he has been a bit constipated lately.  Denies fevers, but has had chills.  States pain is worse with palpation.  Denies prior abdominal surgeries.  History provided by patient.   Review of Systems  Pertinent positive and negative review of systems noted in HPI.    Physical Exam   Vitals:   01/09/24 2211  BP: (!) 146/82  Pulse: 62  Resp: 18  Temp: (!) 97.3 F (36.3 C)  SpO2: 98%    CONSTITUTIONAL:  uncomfortable-appearing, NAD NEURO:  Alert and oriented x 3, CN 3-12 grossly intact EYES:  eyes equal and reactive ENT/NECK:  Supple, no stridor  CARDIO:  normal rate, regular rhythm, appears well-perfused  PULM:  No respiratory distress, CTAB GI/GU:  non-distended, generalized abdominal discomfort MSK/SPINE:  No gross deformities, no edema, moves all extremities  SKIN:  no rash, atraumatic   *Additional and/or pertinent findings included in MDM below  Diagnostic and Interventional Summary    EKG Interpretation Date/Time:    Ventricular Rate:    PR Interval:    QRS Duration:    QT Interval:    QTC Calculation:   R Axis:      Text Interpretation:         Labs Reviewed  COMPREHENSIVE METABOLIC PANEL - Abnormal; Notable for the following components:      Result Value   Glucose, Bld 137 (*)    Creatinine, Ser 0.60 (*)    All other components within normal limits  CBC - Abnormal; Notable for the following components:   WBC 14.9 (*)    All other components within normal limits  URINALYSIS, ROUTINE W REFLEX MICROSCOPIC - Abnormal; Notable for the following components:    Hgb urine dipstick SMALL (*)    Ketones, ur 80 (*)    Protein, ur 30 (*)    All other components within normal limits  LIPASE, BLOOD  HIV ANTIBODY (ROUTINE TESTING W REFLEX)  BASIC METABOLIC PANEL    CT ABDOMEN PELVIS W CONTRAST  Final Result      Medications  lactated ringers bolus 1,000 mL (has no administration in time range)  enoxaparin (LOVENOX) injection 40 mg (has no administration in time range)  lactated ringers infusion (has no administration in time range)  cefTRIAXone (ROCEPHIN) 2 g in sodium chloride 0.9 % 100 mL IVPB (has no administration in time range)    And  metroNIDAZOLE (FLAGYL) IVPB 500 mg (has no administration in time range)  acetaminophen (TYLENOL) tablet 650 mg (has no administration in time range)  ketorolac (TORADOL) 15 MG/ML injection 15 mg (has no administration in time range)  gabapentin (NEURONTIN) capsule 300 mg (has no administration in time range)  oxyCODONE (Oxy IR/ROXICODONE) immediate release tablet 5 mg (has no administration in time range)  oxyCODONE (Oxy IR/ROXICODONE) immediate release tablet 10 mg (has no administration in time range)  HYDROmorphone (DILAUDID) injection 1 mg (has no administration in time range)  methocarbamol (ROBAXIN) injection 500 mg (has no administration in time range)  ondansetron (ZOFRAN) injection 4 mg (has no administration in time range)  prochlorperazine (COMPAZINE) injection 10 mg (has no administration in time range)  simethicone (MYLICON) chewable tablet 80 mg (has no administration in time range)  docusate sodium (COLACE) capsule 100 mg (has no administration in time range)  ondansetron (ZOFRAN-ODT) disintegrating tablet 4 mg (4 mg Oral Given 01/09/24 2232)  oxyCODONE-acetaminophen (PERCOCET/ROXICET) 5-325 MG per tablet 1 tablet (1 tablet Oral Given 01/09/24 2233)  HYDROmorphone (DILAUDID) injection 0.5 mg (0.5 mg Intravenous Given 01/10/24 0310)  iohexol (OMNIPAQUE) 350 MG/ML injection 75 mL (75 mLs Intravenous  Contrast Given 01/10/24 0324)     Procedures  /  Critical Care Procedures  ED Course and Medical Decision Making  I have reviewed the triage vital signs, the nursing notes, and pertinent available records from the EMR.  Social Determinants Affecting Complexity of Care: Patient has no clinically significant social determinants affecting this chief complaint..   ED Course:    Medical Decision Making Patient here with generalized abdominal pain for the past day or 2.  He is somewhat tender.  Has elevated WBC.  Will check CT.  He has also had some mild dysuria, UA is pending.  CT consistent with appendicitis.   Amount and/or Complexity of Data Reviewed Radiology: ordered.  Risk Prescription drug management. Decision regarding hospitalization.         Consultants: I consulted with Dr. Dossie Der, who recommends admission for appendectomy.   Treatment and Plan: Patient's exam and diagnostic results are concerning for appendicitis.  Feel that patient will need admission to the hospital for further treatment and evaluation.    Final Clinical Impressions(s) / ED Diagnoses     ICD-10-CM   1. Acute appendicitis with localized peritonitis, without perforation or abscess, unspecified whether gangrene present  K35.30       ED Discharge Orders     None         Discharge Instructions Discussed with and Provided to Patient:   Discharge Instructions   None      Roxy Horseman, PA-C 01/10/24 0409    Glynn Octave, MD 01/10/24 [phone]

## 2024-01-10 NOTE — ED Notes (Signed)
 Patient transported to CT

## 2024-01-10 NOTE — H&P (Signed)
 Admitting Physician: Douglas Maxwell  Service: General Surgery  CC: Abdominal pain  Subjective   HPI: Douglas Maxwell is an 43 y.o. male who is here for abdominal pain.  Located in the right lower quadrant.  Pain has been present for one day.  Not much of an appetite.  No vomiting.  Past Medical History:  Diagnosis Date   GERD (gastroesophageal reflux disease)     Past Surgical History:  Procedure Laterality Date   TONSILLECTOMY  65    Family History  Problem Relation Age of Onset   Cancer Mother        lung   Diabetes Father    COPD Father     Social:  reports that he has been smoking cigarettes. He has never used smokeless tobacco. He reports that he does not drink alcohol and does not use drugs.  Allergies:  Allergies  Allergen Reactions   Antihistamines, Diphenhydramine-Type Anaphylaxis    Medications: Current Outpatient Medications  Medication Instructions   ibuprofen (ADVIL) 600 MG tablet TAKE 1 TABLET BY MOUTH EVERY 6 HOURS AS NEEDED    ROS - all of the below systems have been reviewed with the patient and positives are indicated with bold text General: chills, fever or night sweats Eyes: blurry vision or double vision ENT: epistaxis or sore throat Allergy/Immunology: itchy/watery eyes or nasal congestion Hematologic/Lymphatic: bleeding problems, blood clots or swollen lymph nodes Endocrine: temperature intolerance or unexpected weight changes Breast: new or changing breast lumps or nipple discharge Resp: cough, shortness of breath, or wheezing CV: chest pain or dyspnea on exertion GI: as per HPI GU: dysuria, trouble voiding, or hematuria MSK: joint pain or joint stiffness Neuro: TIA or stroke symptoms Derm: pruritus and skin lesion changes Psych: anxiety and depression  Objective   PE Blood pressure (!) 146/82, pulse 62, temperature (!) 97.3 F (36.3 C), temperature source Oral, resp. rate 18, height 5\' 9"  (1.753 m), weight 125.4 kg,  SpO2 98%. Constitutional: NAD; conversant; no deformities Eyes: Moist conjunctiva; no lid lag; anicteric; PERRL Neck: Trachea midline; no thyromegaly Lungs: Normal respiratory effort; no tactile fremitus CV: RRR; no palpable thrills; no pitting edema GI: Abd Soft, tender RLQ; no palpable hepatosplenomegaly MSK: Normal range of motion of extremities; no clubbing/cyanosis Psychiatric: Appropriate affect; alert and oriented x3 Lymphatic: No palpable cervical or axillary lymphadenopathy  Results for orders placed or performed during the hospital encounter of 01/09/24 (from the past 24 hours)  Urinalysis, Routine w reflex microscopic -Urine, Clean Catch     Status: Abnormal   Collection Time: 01/09/24 10:29 PM  Result Value Ref Range   Color, Urine YELLOW YELLOW   APPearance CLEAR CLEAR   Specific Gravity, Urine 1.020 1.005 - 1.030   pH 5.0 5.0 - 8.0   Glucose, UA NEGATIVE NEGATIVE mg/dL   Hgb urine dipstick SMALL (A) NEGATIVE   Bilirubin Urine NEGATIVE NEGATIVE   Ketones, ur 80 (A) NEGATIVE mg/dL   Protein, ur 30 (A) NEGATIVE mg/dL   Nitrite NEGATIVE NEGATIVE   Leukocytes,Ua NEGATIVE NEGATIVE   RBC / HPF 6-10 0 - 5 RBC/hpf   WBC, UA 0-5 0 - 5 WBC/hpf   Bacteria, UA NONE SEEN NONE SEEN   Squamous Epithelial / HPF 0-5 0 - 5 /HPF   Mucus PRESENT   Lipase, blood     Status: None   Collection Time: 01/09/24 10:31 PM  Result Value Ref Range   Lipase 25 11 - 51 U/L  Comprehensive metabolic panel  Status: Abnormal   Collection Time: 01/09/24 10:31 PM  Result Value Ref Range   Sodium 136 135 - 145 mmol/L   Potassium 4.2 3.5 - 5.1 mmol/L   Chloride 101 98 - 111 mmol/L   CO2 25 22 - 32 mmol/L   Glucose, Bld 137 (H) 70 - 99 mg/dL   BUN 10 6 - 20 mg/dL   Creatinine, Ser 4.40 (L) 0.61 - 1.24 mg/dL   Calcium 8.9 8.9 - 34.7 mg/dL   Total Protein 7.3 6.5 - 8.1 g/dL   Albumin 4.2 3.5 - 5.0 g/dL   AST 22 15 - 41 U/L   ALT 32 0 - 44 U/L   Alkaline Phosphatase 49 38 - 126 U/L   Total  Bilirubin 0.7 0.0 - 1.2 mg/dL   GFR, Estimated >42 >59 mL/min   Anion gap 10 5 - 15  CBC     Status: Abnormal   Collection Time: 01/09/24 10:31 PM  Result Value Ref Range   WBC 14.9 (H) 4.0 - 10.5 K/uL   RBC 5.44 4.22 - 5.81 MIL/uL   Hemoglobin 15.9 13.0 - 17.0 g/dL   HCT 56.3 87.5 - 64.3 %   MCV 88.1 80.0 - 100.0 fL   MCH 29.2 26.0 - 34.0 pg   MCHC 33.2 30.0 - 36.0 g/dL   RDW 32.9 51.8 - 84.1 %   Platelets 282 150 - 400 K/uL   nRBC 0.0 0.0 - 0.2 %     Imaging Orders         CT ABDOMEN PELVIS W CONTRAST     Acute appendicitis with proximal appendicolith, as above. No drainable fluid collection/abscess.  Assessment and Plan   Douglas Maxwell is an 43 y.o. male with appendicitis.  I recommended laparoscopic appendectomy.  We discussed the procedure, its risks, benefits and alternatives.  After a full discussion and all questions answered the patient granted consent to proceed.  Quentin Ore, MD  [facility] Use [email or web address] to contact on call provider  New Patient Billing: [id number] - High MDM

## 2024-01-10 NOTE — Progress Notes (Signed)
 Patient ID: Douglas Maxwell, male   DOB: 16-Nov-1981, 43 y.o.   MRN: [id number] * Day of Surgery *    Subjective: In pre-op, lower ABD pain ROS negative except as listed above. Objective: Vital signs in last 24 hours: Temp:  [97.3 F (36.3 C)-98.2 F (36.8 C)] 98.2 F (36.8 C) (02/23 0758) Pulse Rate:  [62-90] 90 (02/23 0758) Resp:  [16-18] 16 (02/23 0758) BP: (132-146)/(82-83) 132/83 (02/23 0758) SpO2:  [97 %-98 %] 97 % (02/23 0758) Weight:  [125.4 kg] 125.4 kg (02/23 0851)    Intake/Output from previous day: 02/22 0701 - 02/23 0700 In: 1200.8 [IV Piggyback:1200.8] Out: -  Intake/Output this shift: No intake/output data recorded.  General appearance: alert and cooperative Neck: no adenopathy and no JVD Resp: clear to auscultation bilaterally GI: tender RLQ and R mid, mild tenderness elsewhere Extremities: extremities normal, atraumatic, no cyanosis or edema Neurologic: Mental status: Alert, oriented, thought content appropriate  Lab Results: CBC  Recent Labs    01/09/24 2231  WBC 14.9*  HGB 15.9  HCT 47.9  PLT 282   BMET Recent Labs    01/09/24 2231 01/10/24 0723  NA 136 136  K 4.2 3.7  CL 101 103  CO2 25 22  GLUCOSE 137* 153*  BUN 10 5*  CREATININE 0.60* 0.71  CALCIUM 8.9 8.9   PT/INR No results for input(s): "LABPROT", "INR" in the last 72 hours. ABG No results for input(s): "PHART", "HCO3" in the last 72 hours.  Invalid input(s): "PCO2", "PO2"  Studies/Results: CT ABDOMEN PELVIS W CONTRAST Result Date: 01/10/2024 CLINICAL DATA:  Abdominal pain EXAM: CT ABDOMEN AND PELVIS WITH CONTRAST TECHNIQUE: Multidetector CT imaging of the abdomen and pelvis was performed using the standard protocol following bolus administration of intravenous contrast. RADIATION DOSE REDUCTION: This exam was performed according to the departmental dose-optimization program which includes automated exposure control, adjustment of the mA and/or kV according to patient size and/or  use of iterative reconstruction technique. CONTRAST:  75mL OMNIPAQUE IOHEXOL 350 MG/ML SOLN COMPARISON:  None Available. FINDINGS: Lower chest: Lung bases are clear. Hepatobiliary: Liver is within normal limits. Gallbladder is unremarkable. No intrahepatic or extrahepatic ductal dilatation. Pancreas: Within normal limits. Spleen: Within normal limits. Adrenals/Urinary Tract: Adrenal glands are within normal limits. Right kidney is within normal limits. Two subcentimeter left renal cysts, benign (Bosniak I). No follow-up is recommended. No hydronephrosis. Bladder is within normal limits. Stomach/Bowel: Stomach is within normal limits. No evidence of bowel obstruction. Mildly distended appendix, measuring 14 mm (series 3/image 68), with mild periappendiceal inflammatory changes (series 3/image 63) and a proximal appendicolith (series 3/image 66). No drainable fluid collection/abscess. No colonic wall thickening or inflammatory changes. Vascular/Lymphatic: No evidence of abdominal aortic aneurysm. Atherosclerotic calcifications of the abdominal aorta and branch vessels, although vessels remain patent. No suspicious abdominopelvic lymphadenopathy. Reproductive: Prostate is notable for dystrophic calcifications. Other: No abdominopelvic ascites. Musculoskeletal: Visualized osseous structures are within normal limits. IMPRESSION: Acute appendicitis with proximal appendicolith, as above. No drainable fluid collection/abscess. Electronically Signed   By: Charline Bills M.D.   On: 01/10/2024 03:28    Anti-infectives: Anti-infectives (From admission, onward)    Start     Dose/Rate Route Frequency Ordered Stop   01/10/24 0857  sodium chloride 0.9 % with cefTRIAXone (ROCEPHIN) ADS Med       Note to Pharmacy: Aquilla Hacker M: cabinet override      01/10/24 0857 01/10/24 2059   01/10/24 0430  [MAR Hold]  cefTRIAXone (ROCEPHIN) 2 g in sodium  chloride 0.9 % 100 mL IVPB        (MAR Hold since Sun 01/10/2024 at 0849.Hold  Reason: Transfer to a Procedural area)  Placed in "And" Linked Group   2 g 200 mL/hr over 30 Minutes Intravenous Daily 01/10/24 0343 01/17/24 0959   01/10/24 0430  [MAR Hold]  metroNIDAZOLE (FLAGYL) IVPB 500 mg        (MAR Hold since Sun 01/10/2024 at 0849.Hold Reason: Transfer to a Procedural area)  Placed in "And" Linked Group   500 mg 100 mL/hr over 60 Minutes Intravenous 2 times daily 01/10/24 0343 01/17/24 0959       Assessment/Plan: Acute appendicitis - IV rocephin and flagyl, to OR for laparoscopic appendectomy. Procedure, risks, and benefits discussed and he agrees.  LOS: 0 days    Violeta Gelinas, MD, MPH, FACS Trauma & General Surgery Use [email or web address] to contact on call provider  01/10/2024

## 2024-01-10 NOTE — Op Note (Signed)
  01/10/2024  11:05 AM  PATIENT:  Douglas Maxwell  43 y.o. male  PRE-OPERATIVE DIAGNOSIS:  ACUTE APPENDICITIS  POST-OPERATIVE DIAGNOSIS:  ACUTE GANGRENOUS APPENDICITIS, NO PERFORATION  PROCEDURE:  Procedure(s): APPENDECTOMY LAPAROSCOPIC  SURGEON:  Surgeon(s): Violeta Gelinas, MD  ASSISTANTS: none   ANESTHESIA:   local and general  EBL:  Total I/O In: 800 [I.V.:800] Out: 5 [Blood:5]  BLOOD ADMINISTERED:none  DRAINS: none   SPECIMEN:  Excision  DISPOSITION OF SPECIMEN:  PATHOLOGY  COUNTS:  YES  DICTATION: .Dragon Dictation Findings: Acute gangrenous appendicitis without perforation  Procedure detail: Informed consent was obtained.  He received intravenous antibiotics.  He was brought to the operating room and general endotracheal anesthesia was administered by the anesthesia staff.  His abdomen was prepped and draped in a sterile fashion.  We did a timeout procedure.The supraumbilical region was infiltrated with local. Supraumbilical incision was made. Subcutaneous tissues were dissected down revealing the anterior fascia. This was divided sharply along the midline. Peritoneal cavity was entered under direct vision without complication. A 0 Vicryl pursestring was placed around the fascial opening. Hassan trocar was inserted into the abdomen. The abdomen was insufflated with carbon dioxide in standard fashion. Under direct vision a 12 mm left lower quadrant and a 5 mm right mid abdominal port were placed.  Local was used at each port site.  Laparoscopic exploration revealed an acute gangrenous appendicitis without perforation.  The mesoappendix was taken down with a harmonic scalpel achieving excellent hemostasis.  He was noted to have a appendicolith at the base.  I was able to limit milk that back into the cecum and the base was intact.  I divided the base with Endo GIA with a vascular load.  The appendix was placed in a bag and removed from the abdomen via the left lower quadrant  port site.  It was sent to pathology.  The area was copiously irrigated.  The staple line on the cecum was intact.  There was no bleeding.  Irrigation fluid was evacuated.  Ports were removed under direct vision.  Pneumoperitoneum was released.  Supraumbilical fascia was closed by tying the pursestring.  I placed an additional 0 Vicryl figure-of-eight to reinforce the fascial closure.  All 3 wounds were irrigated and the skin of each was closed with 4-0 Vicryl followed by Dermabond.  All counts were correct.  He tolerated the procedure well without apparent complication was taken recovery in stable condition. PATIENT DISPOSITION:  PACU - hemodynamically stable.   Delay start of Pharmacological VTE agent (>24hrs) due to surgical blood loss or risk of bleeding:  no  Violeta Gelinas, MD, MPH, FACS Pager: [phone]  2/23/202511:05 AM

## 2024-01-11 ENCOUNTER — Encounter (HOSPITAL_COMMUNITY): Payer: Self-pay | Admitting: General Surgery

## 2024-01-11 NOTE — Discharge Summary (Signed)
    Patient ID: [name] [id number] 1980-11-27 43 y.o.  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 male who is here for abdominal pain.  Located in the right lower quadrant.  Pain has been present for one day.  Not much of an appetite.  No vomiting.   Procedures Lap appy, Dr. Janee Maxwell, 01/10/24  Hospital Course:  The patient was taken to the OR and underwent the above procedure.  He tolerated this well.  It was noted to be gangrenous and so he was discharged with 5 days of abx therapy.  Appropriate follow up was made for the patient as well.   Allergies as of 01/10/2024       Reactions   Antihistamines, Diphenhydramine-type Anaphylaxis   Marijuana (cannabis Sativa) Other (See Comments)   Throat  swells shut        Medication List     TAKE these medications    acetaminophen 500 MG tablet Commonly known as: TYLENOL Take 1,000 mg by mouth every 6 (six) hours as needed for mild pain (pain score 1-3) or headache.    amoxicillin-clavulanate 875-125 MG tablet Commonly known as: AUGMENTIN Take 1 tablet by mouth 2 (two) times daily for 5 days.   calcium carbonate 750 MG chewable tablet Commonly known as: TUMS EX Chew 1 tablet by mouth as needed for heartburn.   folic acid 400 MCG tablet Commonly known as: FOLVITE Take 400 mcg by mouth daily.   ibuprofen 200 MG tablet Commonly known as: ADVIL Take 400 mg by mouth every 6 (six) hours as needed for mild pain (pain score 1-3) or headache (tooth ache).   oxyCODONE 5 MG immediate release tablet Commonly known as: Oxy IR/ROXICODONE Take 1 tablet (5 mg total) by mouth every 6 (six) hours as needed (pain).          Follow-up Information     Maxwell, Douglas Slade, PA-C Follow up in 3 week(s).   Specialty: General Surgery Why: Office will call you with a follow up appointment, If you don't hear from the office, please call, Arrive 30 minutes prior to your appointment time, Please bring your insurance card and photo ID Contact information: [address] [place] [address] [facility] [place] [place] [address] [phone]                 Signed: Barnetta Maxwell, [facility] 01/11/2024, 4:17 PM Please see Amion for pager number during day hours 7:00am-4:30pm, 7-11:30am on Weekends

## 2024-01-12 LAB — SURGICAL PATHOLOGY

## 2024-09-25 ENCOUNTER — Encounter (INDEPENDENT_AMBULATORY_CARE_PROVIDER_SITE_OTHER): Payer: Self-pay

## 2024-10-17 ENCOUNTER — Ambulatory Visit: Admitting: Internal Medicine

## 2024-10-17 ENCOUNTER — Ambulatory Visit: Payer: Self-pay | Admitting: Internal Medicine

## 2024-10-17 VITALS — BP 138/80 | HR 80 | Temp 97.9°F | Ht 67.0 in | Wt 278.0 lb

## 2024-10-17 DIAGNOSIS — Z0001 Encounter for general adult medical examination with abnormal findings: Secondary | ICD-10-CM | POA: Insufficient documentation

## 2024-10-17 DIAGNOSIS — L989 Disorder of the skin and subcutaneous tissue, unspecified: Secondary | ICD-10-CM | POA: Diagnosis not present

## 2024-10-17 DIAGNOSIS — Z125 Encounter for screening for malignant neoplasm of prostate: Secondary | ICD-10-CM

## 2024-10-17 DIAGNOSIS — F17200 Nicotine dependence, unspecified, uncomplicated: Secondary | ICD-10-CM | POA: Diagnosis not present

## 2024-10-17 DIAGNOSIS — Z23 Encounter for immunization: Secondary | ICD-10-CM

## 2024-10-17 DIAGNOSIS — F32A Depression, unspecified: Secondary | ICD-10-CM | POA: Diagnosis not present

## 2024-10-17 DIAGNOSIS — R739 Hyperglycemia, unspecified: Secondary | ICD-10-CM

## 2024-10-17 DIAGNOSIS — Z1159 Encounter for screening for other viral diseases: Secondary | ICD-10-CM

## 2024-10-17 DIAGNOSIS — K429 Umbilical hernia without obstruction or gangrene: Secondary | ICD-10-CM | POA: Insufficient documentation

## 2024-10-17 DIAGNOSIS — Z0000 Encounter for general adult medical examination without abnormal findings: Secondary | ICD-10-CM

## 2024-10-17 LAB — URINALYSIS, ROUTINE W REFLEX MICROSCOPIC
Bilirubin Urine: NEGATIVE
Hgb urine dipstick: NEGATIVE
Ketones, ur: NEGATIVE
Leukocytes,Ua: NEGATIVE
Nitrite: NEGATIVE
RBC / HPF: NONE SEEN (ref 0–?)
Specific Gravity, Urine: 1.02 (ref 1.000–1.030)
Total Protein, Urine: NEGATIVE
Urine Glucose: 100 — AB
Urobilinogen, UA: 0.2 (ref 0.0–1.0)
pH: 6.5 (ref 5.0–8.0)

## 2024-10-17 LAB — CBC WITH DIFFERENTIAL/PLATELET
Basophils Absolute: 0 K/uL (ref 0.0–0.1)
Basophils Relative: 0.5 % (ref 0.0–3.0)
Eosinophils Absolute: 0.4 K/uL (ref 0.0–0.7)
Eosinophils Relative: 4.4 % (ref 0.0–5.0)
HCT: 44.7 % (ref 39.0–52.0)
Hemoglobin: 15 g/dL (ref 13.0–17.0)
Lymphocytes Relative: 29.6 % (ref 12.0–46.0)
Lymphs Abs: 2.7 K/uL (ref 0.7–4.0)
MCHC: 33.6 g/dL (ref 30.0–36.0)
MCV: 87 fl (ref 78.0–100.0)
Monocytes Absolute: 0.7 K/uL (ref 0.1–1.0)
Monocytes Relative: 7.8 % (ref 3.0–12.0)
Neutro Abs: 5.4 K/uL (ref 1.4–7.7)
Neutrophils Relative %: 57.7 % (ref 43.0–77.0)
Platelets: 292 K/uL (ref 150.0–400.0)
RBC: 5.14 Mil/uL (ref 4.22–5.81)
RDW: 13.8 % (ref 11.5–15.5)
WBC: 9.3 K/uL (ref 4.0–10.5)

## 2024-10-17 LAB — LIPID PANEL
Cholesterol: 196 mg/dL (ref 0–200)
HDL: 41.7 mg/dL (ref >39.00)
LDL Cholesterol: 101 mg/dL — ABNORMAL HIGH (ref 0–99)
NonHDL: 154.56
Total CHOL/HDL Ratio: 5
Triglycerides: 269 mg/dL — ABNORMAL HIGH (ref 0.0–149.0)
VLDL: 53.8 mg/dL — ABNORMAL HIGH (ref 0.0–40.0)

## 2024-10-17 LAB — HEPATIC FUNCTION PANEL
ALT: 37 U/L (ref 0–53)
AST: 20 U/L (ref 0–37)
Albumin: 4.5 g/dL (ref 3.5–5.2)
Alkaline Phosphatase: 62 U/L (ref 39–117)
Bilirubin, Direct: 0.1 mg/dL (ref 0.0–0.3)
Total Bilirubin: 0.5 mg/dL (ref 0.2–1.2)
Total Protein: 7 g/dL (ref 6.0–8.3)

## 2024-10-17 LAB — BASIC METABOLIC PANEL WITH GFR
BUN: 13 mg/dL (ref 6–23)
CO2: 29 meq/L (ref 19–32)
Calcium: 9.8 mg/dL (ref 8.4–10.5)
Chloride: 101 meq/L (ref 96–112)
Creatinine, Ser: 0.64 mg/dL (ref 0.40–1.50)
GFR: 117.02 mL/min (ref >60.00)
Glucose, Bld: 176 mg/dL — ABNORMAL HIGH (ref 70–99)
Potassium: 4.2 meq/L (ref 3.5–5.1)
Sodium: 138 meq/L (ref 135–145)

## 2024-10-17 LAB — HEMOGLOBIN A1C: Hgb A1c MFr Bld: 8.4 % — ABNORMAL HIGH (ref 4.6–6.5)

## 2024-10-17 LAB — TSH: TSH: 1.49 u[IU]/mL (ref 0.35–5.50)

## 2024-10-17 LAB — PSA: PSA: 0.52 ng/mL (ref 0.10–4.00)

## 2024-10-17 MED ORDER — METFORMIN HCL ER 500 MG PO TB24: 500.0000 mg | ORAL_TABLET | Freq: Every day | ORAL | 3 refills | Status: AC

## 2024-10-17 MED ORDER — CITALOPRAM HYDROBROMIDE 20 MG PO TABS: 20.0000 mg | ORAL_TABLET | Freq: Every day | ORAL | 3 refills | Status: DC

## 2024-10-17 NOTE — Assessment & Plan Note (Signed)
 Pt counsled to quit, pt not ready

## 2024-10-17 NOTE — Assessment & Plan Note (Signed)
 Small subq nodule like mass firm likely post inflammatory, benign appearance, ok to follow

## 2024-10-17 NOTE — Assessment & Plan Note (Addendum)
 Lab Results  Component Value Date   HGBA1C 8.4 (H) 10/17/2024   Uncontrolled, pt to start metformin ER 500 mg qd

## 2024-10-17 NOTE — Patient Instructions (Signed)
 You had the flu shot today  Please take all new medication as prescribed-  the celexa 20 mg per day  Please continue all other medications as before, and refills have been done if requested.  Please have the pharmacy call with any other refills you may need.  Please continue your efforts at being more active, low cholesterol diet, and weight control.  You are otherwise up to date with prevention measures today.  Please keep your appointments with your specialists as you may have planned  Please go to the LAB at the blood drawing area for the tests to be done  You will be contacted by phone if any changes need to be made immediately.  Otherwise, you will receive a letter about your results with an explanation, but please check with MyChart first.  Please make an Appointment to return for your 1 year visit, or sooner if needed

## 2024-10-17 NOTE — Assessment & Plan Note (Signed)
 Small nontender, reducible, no pain - ok to continue to follow

## 2024-10-17 NOTE — Progress Notes (Signed)
 Patient ID: Douglas Maxwell, male   DOB: September 26, 1981, 43 y.o.   MRN: [id number]         Chief Complaint:: wellness exam and Annual Exam (Issues with surgery scar on stomach and also has blood clot on left leg ) , depression, hyperglycemia, smoker       HPI:  Douglas Maxwell is a 43 y.o. male here for wellness exam; for flu shot today, for hep c screen, declines prevnar and HPV vax for now, o/w up to date                        Also has 2 mo mild worsening depression symptoms and multiple social stressors.  Also has a soft abd wall small area just above the umbilicus that bulges but reducible and no pain.  Also has a firm subcutaneous nodule to left left medial just distal to the knee, wondering about phlebitis.  Pt denies chest pain, increased sob or doe, wheezing, orthopnea, PND, increased LE swelling, palpitations, dizziness or syncope.   Pt denies polydipsia, polyuria, or new focal neuro s/s.    Pt denies fever, wt loss, night sweats, loss of appetite, or other constitutional symptoms   Wt Readings from Last 3 Encounters:  10/17/24 278 lb (126.1 kg)  01/10/24 276 lb 7.3 oz (125.4 kg)  11/28/20 276 lb 6.4 oz (125.4 kg)   BP Readings from Last 3 Encounters:  10/17/24 138/80  01/10/24 105/65  11/28/20 118/80   Immunization History  Administered Date(s) Administered   Influenza, Seasonal, Injecte, Preservative Fre 10/17/2024   PFIZER(Purple Top)SARS-COV-2 Vaccination 02/25/2020, 03/20/2020   Tdap 07/19/2015   Health Maintenance Due  Topic Date Due   Hepatitis C Screening  Never done   Pneumococcal Vaccine (1 of 2 - PCV) Never done   Hepatitis B Vaccines 19-59 Average Risk (1 of 3 - 19+ 3-dose series) Never done   HPV VACCINES (1 - 3-dose SCDM series) Never done      Past Medical History:  Diagnosis Date   Complication of anesthesia    woke up during wisdom teeth extraction   GERD (gastroesophageal reflux disease)    Past Surgical History:  Procedure Laterality Date    LAPAROSCOPIC APPENDECTOMY N/A 01/10/2024   Procedure: APPENDECTOMY LAPAROSCOPIC;  Surgeon: Sebastian Moles, MD;  Location: [facility] OR;  Service: General;  Laterality: N/A;   TONSILLECTOMY  11/17/1988   WISDOM TOOTH EXTRACTION      reports that he has been smoking cigarettes. He has never used smokeless tobacco. He reports that he does not drink alcohol and does not use drugs. family history includes COPD in his father; Cancer in his mother; Diabetes in his father. Allergies  Allergen Reactions   Antihistamines, Diphenhydramine-Type Anaphylaxis   Marijuana (Cannabis Sativa) Other (See Comments)    Throat  swells shut   Current Outpatient Medications on File Prior to Visit  Medication Sig Dispense Refill   acetaminophen  (TYLENOL ) 500 MG tablet Take 1,000 mg by mouth every 6 (six) hours as needed for mild pain (pain score 1-3) or headache.     calcium carbonate (TUMS EX) 750 MG chewable tablet Chew 1 tablet by mouth as needed for heartburn.     ibuprofen  (ADVIL ) 200 MG tablet Take 400 mg by mouth every 6 (six) hours as needed for mild pain (pain score 1-3) or headache (tooth ache).     No current facility-administered medications on file prior to visit.  ROS:  All others reviewed and negative.  Objective        PE:  BP 138/80 (BP Location: Right Arm, Patient Position: Sitting, Cuff Size: Normal)   Pulse 80   Temp 97.9 F (36.6 C) (Oral)   Ht 5' 7 (1.702 m)   Wt 278 lb (126.1 kg)   SpO2 98%   BMI 43.54 kg/m                 Constitutional: Pt appears in NAD               HENT: Head: NCAT.                Right Ear: External ear normal.                 Left Ear: External ear normal.                Eyes: . Pupils are equal, round, and reactive to light. Conjunctivae and EOM are normal               Nose: without d/c or deformity               Neck: Neck supple. Gross normal ROM               Cardiovascular: Normal rate and regular rhythm.                 Pulmonary/Chest: Effort  normal and breath sounds without rales or wheezing.                Abd:  Soft, NT, ND, + BS, no organomegaly; has small nontender abd wall hernia noted reducible nontender just above the umbilicus               Neurological: Pt is alert. At baseline orientation, motor grossly intact               Skin: Skin is warm. No rashes, no other new lesions, LE edema - none,                Psychiatric: Pt behavior is normal without agitation , depressed affect  Micro: none  Cardiac tracings I have personally interpreted today:  none  Pertinent Radiological findings (summarize): none   Lab Results  Component Value Date   WBC 9.3 10/17/2024   HGB 15.0 10/17/2024   HCT 44.7 10/17/2024   PLT 292.0 10/17/2024   GLUCOSE 176 (H) 10/17/2024   CHOL 196 10/17/2024   TRIG 269.0 (H) 10/17/2024   HDL 41.70 10/17/2024   LDLCALC 101 (H) 10/17/2024   ALT 37 10/17/2024   AST 20 10/17/2024   NA 138 10/17/2024   K 4.2 10/17/2024   CL 101 10/17/2024   CREATININE 0.64 10/17/2024   BUN 13 10/17/2024   CO2 29 10/17/2024   TSH 1.49 10/17/2024   PSA 0.52 10/17/2024   HGBA1C 8.4 (H) 10/17/2024   Assessment/Plan:  Douglas Maxwell is a 43 y.o. White or Caucasian [1] male with  has a past medical history of Complication of anesthesia and GERD (gastroesophageal reflux disease).  Encounter for well adult exam with abnormal findings Age and sex appropriate education and counseling updated with regular exercise and diet Referrals for preventative services - for hep c screen Immunizations addressed - for flu shot, declines prevnar and HPV Smoking counseling  - none needed Evidence for depression or other mood disorder - with mild onset recent, for celexa 20 mg  qd Most recent labs reviewed. I have personally reviewed and have noted: 1) the patient's medical and social history 2) The patient's current medications and supplements 3) The patient's height, weight, and BMI have been recorded in the  chart   Depression With mild recent onset, for celexa 20 mg every day, declines counseling for now  Umbilical hernia Small nontender, reducible, no pain - ok to continue to follow  Smoker Pt counsled to quit, pt not ready  Skin lesion of left leg Small subq nodule like mass firm likely post inflammatory, benign appearance, ok to follow  Hyperglycemia Lab Results  Component Value Date   HGBA1C 8.4 (H) 10/17/2024   Uncontrolled, pt to start metformin ER 500 mg qd  Followup: Return in about 1 year (around 10/17/2025).  Lynwood Rush, MD 10/17/2024 8:39 PM [facility]

## 2024-10-17 NOTE — Assessment & Plan Note (Signed)
 Age and sex appropriate education and counseling updated with regular exercise and diet Referrals for preventative services - for hep c screen Immunizations addressed - for flu shot, declines prevnar and HPV Smoking counseling  - none needed Evidence for depression or other mood disorder - with mild onset recent, for celexa 20 mg qd Most recent labs reviewed. I have personally reviewed and have noted: 1) the patient's medical and social history 2) The patient's current medications and supplements 3) The patient's height, weight, and BMI have been recorded in the chart

## 2024-10-17 NOTE — Assessment & Plan Note (Signed)
 With mild recent onset, for celexa 20 mg every day, declines counseling for now

## 2024-10-18 LAB — HEPATITIS C ANTIBODY: Hepatitis C Ab: NONREACTIVE

## 2024-10-20 MED ORDER — BUPROPION HCL ER (XL) 150 MG PO TB24: 150.0000 mg | ORAL_TABLET | Freq: Every day | ORAL | 3 refills | Status: AC

## 2024-10-24 ENCOUNTER — Encounter: Admitting: Internal Medicine
